# Patient Record
Sex: Female | Born: 1955 | Race: White | Hispanic: No | Marital: Single | Smoking: Never smoker
Health system: Southern US, Community
[De-identification: ages and names within clinical notes are randomized; demographics above are authoritative.]

## PROBLEM LIST (undated history)

## (undated) DIAGNOSIS — J309 Allergic rhinitis, unspecified: Secondary | ICD-10-CM

## (undated) DIAGNOSIS — G5600 Carpal tunnel syndrome, unspecified upper limb: Secondary | ICD-10-CM

## (undated) DIAGNOSIS — K5730 Diverticulosis of large intestine without perforation or abscess without bleeding: Secondary | ICD-10-CM

## (undated) DIAGNOSIS — G250 Essential tremor: Secondary | ICD-10-CM

## (undated) DIAGNOSIS — R32 Unspecified urinary incontinence: Secondary | ICD-10-CM

## (undated) DIAGNOSIS — D649 Anemia, unspecified: Secondary | ICD-10-CM

## (undated) DIAGNOSIS — G43909 Migraine, unspecified, not intractable, without status migrainosus: Secondary | ICD-10-CM

## (undated) DIAGNOSIS — N959 Unspecified menopausal and perimenopausal disorder: Secondary | ICD-10-CM

## (undated) DIAGNOSIS — E785 Hyperlipidemia, unspecified: Secondary | ICD-10-CM

## (undated) DIAGNOSIS — Z86010 Personal history of colonic polyps: Secondary | ICD-10-CM

## (undated) DIAGNOSIS — M129 Arthropathy, unspecified: Secondary | ICD-10-CM

## (undated) DIAGNOSIS — F411 Generalized anxiety disorder: Secondary | ICD-10-CM

## (undated) DIAGNOSIS — G252 Other specified forms of tremor: Secondary | ICD-10-CM

## (undated) DIAGNOSIS — I8390 Asymptomatic varicose veins of unspecified lower extremity: Secondary | ICD-10-CM

## (undated) DIAGNOSIS — R0789 Other chest pain: Secondary | ICD-10-CM

## (undated) HISTORY — DX: Carpal tunnel syndrome, unspecified upper limb: G56.00

## (undated) HISTORY — DX: Hyperlipidemia, unspecified: E78.5

## (undated) HISTORY — DX: Migraine, unspecified, not intractable, without status migrainosus: G43.909

## (undated) HISTORY — DX: Unspecified urinary incontinence: R32

## (undated) HISTORY — DX: Diverticulosis of large intestine without perforation or abscess without bleeding: K57.30

## (undated) HISTORY — DX: Anemia, unspecified: D64.9

## (undated) HISTORY — DX: Essential tremor: G25.0

## (undated) HISTORY — DX: Arthropathy, unspecified: M12.9

## (undated) HISTORY — DX: Unspecified menopausal and perimenopausal disorder: N95.9

## (undated) HISTORY — DX: Other chest pain: R07.89

## (undated) HISTORY — DX: Allergic rhinitis, unspecified: J30.9

## (undated) HISTORY — DX: Personal history of colonic polyps: Z86.010

## (undated) HISTORY — DX: Asymptomatic varicose veins of unspecified lower extremity: I83.90

## (undated) HISTORY — DX: Other specified forms of tremor: G25.2

## (undated) HISTORY — DX: Generalized anxiety disorder: F41.1

---
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
## 1999-05-16 ENCOUNTER — Emergency Department (HOSPITAL_COMMUNITY): Admission: EM | Admit: 1999-05-16 | Discharge: 1999-05-16 | Payer: Self-pay | Admitting: *Deleted

## 2002-05-18 HISTORY — PX: ABDOMINAL HYSTERECTOMY: SHX81

## 2004-11-12 ENCOUNTER — Emergency Department (HOSPITAL_COMMUNITY): Admission: EM | Admit: 2004-11-12 | Discharge: 2004-11-12 | Payer: Self-pay | Admitting: Emergency Medicine

## 2006-05-18 HISTORY — PX: REPLACEMENT TOTAL KNEE: SUR1224

## 2006-08-27 ENCOUNTER — Emergency Department (HOSPITAL_COMMUNITY): Admission: EM | Admit: 2006-08-27 | Discharge: 2006-08-27 | Payer: Self-pay | Admitting: Emergency Medicine

## 2006-09-30 ENCOUNTER — Encounter: Payer: Self-pay | Admitting: Internal Medicine

## 2006-09-30 LAB — CONVERTED CEMR LAB
ALT: 14 units/L
AST: 11 units/L
Alkaline Phosphatase: 78 units/L
BUN: 16 mg/dL
CO2: 25 meq/L
Calcium: 9.1 mg/dL
Chloride: 101 meq/L
Cholesterol: 233 mg/dL
Creatinine, Ser: 0.65 mg/dL
Glucose, Bld: 94 mg/dL
HCT: 43.9 %
HDL: 47 mg/dL
Hemoglobin: 15 g/dL
LDL Cholesterol: 162 mg/dL
MCV: 88.4 fL
Platelets: 320 10*3/uL
Potassium: 4.4 meq/L
RBC: 4.97 M/uL
RDW: 14 %
Sodium: 138 meq/L
TSH: 1.002 microintl units/mL
Total Bilirubin: 0.4 mg/dL
Triglyceride fasting, serum: 119 mg/dL
WBC: 6.4 10*3/uL

## 2008-10-16 LAB — CONVERTED CEMR LAB: Pap Smear: NEGATIVE

## 2009-04-22 ENCOUNTER — Encounter: Payer: Self-pay | Admitting: Internal Medicine

## 2009-04-22 LAB — CONVERTED CEMR LAB
ALT: 37 units/L
AST: 13 units/L
Albumin: 4.4 g/dL
Alkaline Phosphatase: 101 units/L
BUN: 16 mg/dL
CO2: 23 meq/L
Calcium: 9 mg/dL
Chloride: 104 meq/L
Cholesterol: 250 mg/dL
Creatinine, Ser: 0.61 mg/dL
Glucose, Bld: 85 mg/dL
HCT: 42.1 %
HDL: 50 mg/dL
Hemoglobin: 13.4 g/dL
LDL Cholesterol: 154 mg/dL
Lymphocytes, automated: 34.8 %
MCV: 90.6 fL
Neutrophils Relative %: 57.8 %
Platelets: 357 10*3/uL
Potassium: 4.3 meq/L
RBC: 4.65 M/uL
RDW: 13.2 %
Sodium: 140 meq/L
TSH: 0.502 microintl units/mL
Total Bilirubin: 0.4 mg/dL
Total Protein: 7.1 g/dL
Triglyceride fasting, serum: 230 mg/dL
WBC: 7.6 10*3/uL

## 2009-06-18 DIAGNOSIS — Z86010 Personal history of colonic polyps: Secondary | ICD-10-CM

## 2009-06-18 HISTORY — DX: Personal history of colonic polyps: Z86.010

## 2009-06-24 ENCOUNTER — Encounter: Payer: Self-pay | Admitting: Internal Medicine

## 2009-06-28 ENCOUNTER — Ambulatory Visit: Payer: Self-pay | Admitting: Internal Medicine

## 2009-06-28 DIAGNOSIS — G252 Other specified forms of tremor: Secondary | ICD-10-CM

## 2009-06-28 DIAGNOSIS — R059 Cough, unspecified: Secondary | ICD-10-CM | POA: Insufficient documentation

## 2009-06-28 DIAGNOSIS — N959 Unspecified menopausal and perimenopausal disorder: Secondary | ICD-10-CM | POA: Insufficient documentation

## 2009-06-28 DIAGNOSIS — K5730 Diverticulosis of large intestine without perforation or abscess without bleeding: Secondary | ICD-10-CM | POA: Insufficient documentation

## 2009-06-28 DIAGNOSIS — D649 Anemia, unspecified: Secondary | ICD-10-CM | POA: Insufficient documentation

## 2009-06-28 DIAGNOSIS — E785 Hyperlipidemia, unspecified: Secondary | ICD-10-CM | POA: Insufficient documentation

## 2009-06-28 DIAGNOSIS — N952 Postmenopausal atrophic vaginitis: Secondary | ICD-10-CM | POA: Insufficient documentation

## 2009-06-28 DIAGNOSIS — J309 Allergic rhinitis, unspecified: Secondary | ICD-10-CM | POA: Insufficient documentation

## 2009-06-28 DIAGNOSIS — F411 Generalized anxiety disorder: Secondary | ICD-10-CM | POA: Insufficient documentation

## 2009-06-28 DIAGNOSIS — Z860100 Personal history of colon polyps, unspecified: Secondary | ICD-10-CM | POA: Insufficient documentation

## 2009-06-28 DIAGNOSIS — G250 Essential tremor: Secondary | ICD-10-CM | POA: Insufficient documentation

## 2009-06-28 DIAGNOSIS — R05 Cough: Secondary | ICD-10-CM

## 2009-06-28 DIAGNOSIS — R079 Chest pain, unspecified: Secondary | ICD-10-CM | POA: Insufficient documentation

## 2009-06-28 DIAGNOSIS — R32 Unspecified urinary incontinence: Secondary | ICD-10-CM | POA: Insufficient documentation

## 2009-06-28 DIAGNOSIS — G43909 Migraine, unspecified, not intractable, without status migrainosus: Secondary | ICD-10-CM | POA: Insufficient documentation

## 2009-06-28 DIAGNOSIS — M129 Arthropathy, unspecified: Secondary | ICD-10-CM | POA: Insufficient documentation

## 2009-07-03 ENCOUNTER — Telehealth (INDEPENDENT_AMBULATORY_CARE_PROVIDER_SITE_OTHER): Payer: Self-pay | Admitting: *Deleted

## 2009-07-04 ENCOUNTER — Encounter: Payer: Self-pay | Admitting: Internal Medicine

## 2009-08-15 ENCOUNTER — Telehealth (INDEPENDENT_AMBULATORY_CARE_PROVIDER_SITE_OTHER): Payer: Self-pay | Admitting: *Deleted

## 2009-08-19 ENCOUNTER — Telehealth (INDEPENDENT_AMBULATORY_CARE_PROVIDER_SITE_OTHER): Payer: Self-pay

## 2009-09-11 ENCOUNTER — Telehealth (INDEPENDENT_AMBULATORY_CARE_PROVIDER_SITE_OTHER): Payer: Self-pay | Admitting: *Deleted

## 2009-09-11 DIAGNOSIS — R0789 Other chest pain: Secondary | ICD-10-CM

## 2009-09-11 HISTORY — DX: Other chest pain: R07.89

## 2009-09-12 ENCOUNTER — Ambulatory Visit: Payer: Self-pay | Admitting: Cardiology

## 2009-09-12 ENCOUNTER — Ambulatory Visit: Payer: Self-pay

## 2009-09-12 ENCOUNTER — Encounter (HOSPITAL_COMMUNITY): Admission: RE | Admit: 2009-09-12 | Discharge: 2009-11-20 | Payer: Self-pay | Admitting: Internal Medicine

## 2009-09-27 ENCOUNTER — Ambulatory Visit: Payer: Self-pay | Admitting: Internal Medicine

## 2009-09-27 DIAGNOSIS — G5600 Carpal tunnel syndrome, unspecified upper limb: Secondary | ICD-10-CM | POA: Insufficient documentation

## 2009-09-30 ENCOUNTER — Ambulatory Visit: Payer: Self-pay | Admitting: Internal Medicine

## 2009-09-30 LAB — CONVERTED CEMR LAB
ALT: 26 units/L (ref 0–35)
AST: 20 units/L (ref 0–37)
Albumin: 3.8 g/dL (ref 3.5–5.2)
Alkaline Phosphatase: 80 units/L (ref 39–117)
Bilirubin, Direct: 0.1 mg/dL (ref 0.0–0.3)
Cholesterol: 183 mg/dL (ref 0–200)
HDL: 49.6 mg/dL (ref >39.00)
LDL Cholesterol: 110 mg/dL — ABNORMAL HIGH (ref 0–99)
Total Bilirubin: 0.8 mg/dL (ref 0.3–1.2)
Total CHOL/HDL Ratio: 4
Total Protein: 6.6 g/dL (ref 6.0–8.3)
Triglycerides: 119 mg/dL (ref 0.0–149.0)
VLDL: 23.8 mg/dL (ref 0.0–40.0)

## 2009-12-27 ENCOUNTER — Telehealth: Payer: Self-pay | Admitting: Internal Medicine

## 2009-12-30 ENCOUNTER — Ambulatory Visit: Payer: Self-pay | Admitting: Internal Medicine

## 2009-12-30 DIAGNOSIS — R300 Dysuria: Secondary | ICD-10-CM | POA: Insufficient documentation

## 2009-12-30 DIAGNOSIS — I8390 Asymptomatic varicose veins of unspecified lower extremity: Secondary | ICD-10-CM | POA: Insufficient documentation

## 2009-12-30 LAB — CONVERTED CEMR LAB
Cholesterol: 205 mg/dL — ABNORMAL HIGH (ref 0–200)
Hemoglobin, Urine: NEGATIVE
Ketones, ur: NEGATIVE mg/dL
Specific Gravity, Urine: 1.02 (ref 1.000–1.030)
Total Protein, Urine: NEGATIVE mg/dL
Urine Glucose: NEGATIVE mg/dL
Urobilinogen, UA: 0.2 (ref 0.0–1.0)

## 2010-01-31 ENCOUNTER — Telehealth: Payer: Self-pay | Admitting: Internal Medicine

## 2010-02-04 ENCOUNTER — Ambulatory Visit: Payer: Self-pay | Admitting: Internal Medicine

## 2010-02-04 DIAGNOSIS — J329 Chronic sinusitis, unspecified: Secondary | ICD-10-CM | POA: Insufficient documentation

## 2010-02-04 DIAGNOSIS — R197 Diarrhea, unspecified: Secondary | ICD-10-CM | POA: Insufficient documentation

## 2010-04-04 ENCOUNTER — Ambulatory Visit: Payer: Self-pay | Admitting: Internal Medicine

## 2010-04-04 DIAGNOSIS — N390 Urinary tract infection, site not specified: Secondary | ICD-10-CM | POA: Insufficient documentation

## 2010-04-04 LAB — CONVERTED CEMR LAB
Glucose, Urine, Semiquant: NEGATIVE
Nitrite: NEGATIVE
Protein, U semiquant: NEGATIVE
WBC Urine, dipstick: NEGATIVE

## 2010-04-21 ENCOUNTER — Telehealth: Payer: Self-pay | Admitting: Internal Medicine

## 2010-05-23 ENCOUNTER — Telehealth: Payer: Self-pay | Admitting: Internal Medicine

## 2010-06-17 NOTE — Progress Notes (Signed)
Summary: Nuclear Pre-Procedure  Phone Note Outgoing Call Call back at [facility] Phone [phone]   Call placed by: Stanton Kidney, EMT-P,  August 15, 2009 11:15 AM Action Taken: Phone Call Completed Summary of Call: Left message with information on [employer] Information Sheet (see scanned document for details).     Nuclear Med Background Indications for Stress Test: Evaluation for Ischemia     Symptoms: Chest Pain    Nuclear Pre-Procedure Cardiac Risk Factors: Family History - CAD, Lipids Height (in): 61

## 2010-06-17 NOTE — Assessment & Plan Note (Signed)
Summary: 3-4 MTH FU   STC   Vital Signs:  Patient profile:   55 year old female Height:      61 inches (154.94 cm) Weight:      179.12 pounds (81.42 kg) O2 Sat:      96 % on Room air Temp:     97.4 degrees F (36.33 degrees C) oral Pulse rate:   70 / minute BP sitting:   122 / 88  (left arm) Cuff size:   regular  Vitals Entered By: Orlan Leavens RMA (December 30, 2009 8:59 AM)  O2 Flow:  Room air CC: 3 month follow-up Is Patient Diabetic? No Pain Assessment Patient in pain? no        Primary Care Provider:  Newt Lukes MD  CC:  3 month follow-up.  History of Present Illness: here for 3 month f/u  1) c/o continued hot flashes - related to menopausal syndrome -  took HRT for 3 years following hysterectomy in 2005 - but still with flushing and sweating on or off med- also c/o vaginal dryness and pain with intercourse, low libido since hysterectomy - has not started estrogen cream - taking effexor - no adv SE related to same  2) dyslipidemia -  reports compliance with ongoing medical treatment and no changes in medication dose or frequency. denies adverse side effects related to current therapy. has increase fish oil use- no GI or mskel c/o   3) anxiety -onset 2007- precipitated by sudden death of her sister a/w insomnia - seldom sleeps more that 4h/night - denies daytime sleepiness +tremor when stressed, esp head and neck +palpitations; denies sadness, tearfulness or depression  tol effexor well - but still not sleeping - not using klonopin ot OTC sleep aides  4) allg rhinitis - taking claritin and flonase - symptoms improved less cough with controlled PND symptoms   Preventive Screening-Counseling & Management  Alcohol-Tobacco     Alcohol drinks/day: <1     Alcohol Counseling: not indicated; patient does not drink     Smoking Status: never     Tobacco Counseling: not indicated; no tobacco use  Clinical Review Panels:  Prevention   Last Mammogram:  No  specific mammographic evidence of malignancy.   (10/16/2008)   Last Pap Smear:  Interpretation/Result:Negative for intraepithelial Lesion or Malignancy.    (10/16/2008)   Last Colonoscopy:  Location:  [facility].   Impression: 1. small internal hemorrhoids 2. scattered sigmoid diveticulosis 3. One isolated diverticulum in the right colon 4. One small sessile right colon polyp rempved by cold snare 5. Normal terminal ileum (06/24/2009)  Immunizations   Last Tetanus Booster:  Historical (05/18/2006)   Last Flu Vaccine:  Fluvax 3+ (06/28/2009)  Lipid Management   Cholesterol:  183 (09/30/2009)   LDL (bad choesterol):  110 (09/30/2009)   HDL (good cholesterol):  49.60 (09/30/2009)   Triglycerides:  230 (04/22/2009)  CBC   WBC:  7.6 (04/22/2009)   RBC:  4.65 (04/22/2009)   Hgb:  13.4 (04/22/2009)   Hct:  42.1 (04/22/2009)   Platelets:  357 (04/22/2009)   MCV  90.6 (04/22/2009)   RDW  13.2 (04/22/2009)   PMN:  57.8 (04/22/2009)  Complete Metabolic Panel   Glucose:  85 (04/22/2009)   Sodium:  140 (04/22/2009)   Potassium:  4.3 (04/22/2009)   Chloride:  104 (04/22/2009)   CO2:  23 (04/22/2009)   BUN:  16 (04/22/2009)   Creatinine:  0.61 (04/22/2009)   Albumin:  3.8 (09/30/2009)  Total Protein:  6.6 (09/30/2009)   Calcium:  9.0 (04/22/2009)   Total Bili:  0.8 (09/30/2009)   Alk Phos:  80 (09/30/2009)   SGPT (ALT):  26 (09/30/2009)   SGOT (AST):  20 (09/30/2009)   Current Medications (verified): 1)  One-A-Day Womens Formula  Tabs (Multiple Vitamins-Calcium) .... Once Daily 2)  Fish Oil 1000 Mg Caps (Omega-3 Fatty Acids) .... Once Daily 3)  Vitamin D 1000 Unit Tabs (Cholecalciferol) .... Once Daily 4)  Tessalon Perles 100 Mg Caps (Benzonatate) .Marland Kitchen.. 1 By Mouth Two Times A Day X 2 Weeks, Then Every 8 Hours As Needed For Cough 5)  Omeprazole 20 Mg Cpdr (Omeprazole) .Marland Kitchen.. 1 By Mouth Two Times A Day 6)  Claritin 10 Mg Tabs (Loratadine) .Marland Kitchen.. 1 By Mouth Once  Daily 7)  Premarin 0.625 Mg/gm Crea (Estrogens, Conjugated) .... Apply 2x/week 8)  Klonopin 0.5 Mg Tabs (Clonazepam) .Marland Kitchen.. 1-2 By Mouth At Bedtime As Needed For Sleep 9)  Zocor 40 Mg Tabs (Simvastatin) .Marland Kitchen.. 1 By Mouth At Bedtime 10)  Flonase 50 Mcg/act Susp (Fluticasone Propionate) .... 2 Sprays Each Nostril  Every Morning 11)  Effexor Xr 150 Mg Xr24h-Cap (Venlafaxine Hcl) .Marland Kitchen.. 1 By Mouth Once Daily  Allergies (verified): 1)  ! Codeine 2)  ! Morphine  Past History:  Past Medical History: dyslipidemia anxiety benign essential tremor - head/neck, mild Allergic rhinitis Anemia-NOS Diverticulitis, hx of Colonic polyps, hx   MD roster: GI - mann  Review of Systems  The patient denies fever, chest pain, syncope, and peripheral edema.         c/o tenderness over (spider) vericose veins R>L; also c/o strong urine smell with freq voiding  Physical Exam  General:  overweight-appearing.  alert, well-developed, well-nourished, and cooperative to examination.    Lungs:  normal respiratory effort, no intercostal retractions or use of accessory muscles; normal breath sounds bilaterally - no crackles and no wheezes.    Heart:  normal rate, regular rhythm, no murmur, and no rub. BLE without edema.  Skin:  spider veins over RLE>LLE, min inflammed but no ulceration or redness Psych:  Oriented X3, memory intact for recent and remote, normally interactive, good eye contact, not anxious appearing, not depressed appearing, and not agitated.      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 1-2 by mouth at bedtime as needed  for sleep    Effexor Xr 150 Mg Xr24h-cap (Venlafaxine hcl) .Marland Kitchen... 1 by mouth once daily    Amitriptyline Hcl 10 Mg Tabs (Amitriptyline hcl) .Marland Kitchen... 1 by mouth at bedtime x 7 days, then increase to 2 by mouth at bedtime (or as directed)  cont effexor150 and try amitriptline for sleep - new erx done ok to use klonopin if needed- reminded BZs most effective if not used every night but ok as needed   Orders: Prescription Created Electronically [id number])  Problem # 4:  DYSLIPIDEMIA (ICD-272.4)  Her updated medication list for this problem includes:    Zocor 40 Mg Tabs (Simvastatin) .Marland Kitchen... 1 by mouth at bedtime  Orders: TLB-Lipid Panel (80061-LIPID)  Labs Reviewed: SGOT: 20 (09/30/2009)   SGPT: 26 (09/30/2009)   HDL:49.60 (  09/30/2009), 50 (04/22/2009)  LDL:110 (09/30/2009), 154 (04/22/2009)  Chol:183 (09/30/2009), 250 (04/22/2009)  Trig:119.0 (09/30/2009), 230 (04/22/2009)  Complete Medication List: 1)  One-a-day Womens Formula Tabs (Multiple vitamins-calcium) .... Once daily 2)  Fish Oil 1000 Mg Caps (Omega-3 fatty acids) .... 2 pills once daily 3)  Vitamin D 1000 Unit Tabs (Cholecalciferol) .... Once daily 4)  Tessalon Perles 100 Mg Caps (Benzonatate) .Marland Kitchen.. 1 by mouth two times a day x 2 weeks, then every 8 hours as needed for cough 5)  Omeprazole 20 Mg Cpdr (Omeprazole) .Marland Kitchen.. 1 by mouth two times a day 6)  Claritin 10 Mg Tabs (Loratadine) .Marland Kitchen.. 1 by mouth once daily 7)  Premarin 0.625 Mg/gm Crea (Estrogens, conjugated) .... Apply 2x/week 8)  Klonopin 0.5 Mg Tabs (Clonazepam) .Marland Kitchen.. 1-2 by mouth at bedtime as needed for sleep 9)  Zocor 40 Mg Tabs (Simvastatin) .Marland Kitchen.. 1 by mouth at bedtime 10)  Flonase 50 Mcg/act Susp (Fluticasone propionate) .... 2 sprays each nostril  every morning 11)  Effexor Xr 150 Mg Xr24h-cap (Venlafaxine hcl) .Marland Kitchen.. 1 by mouth once daily 12)  Amitriptyline Hcl 10 Mg Tabs (Amitriptyline hcl) .Marland Kitchen.. 1 by mouth at bedtime x 7 days, then increase to 2 by mouth at bedtime (or as  directed) 13)  Compression Hose, Thigh High  .... Wear each am and remove at bedtime; icd9: 454.9  Patient Instructions: 1)  it was good to see you today. 2)  test(s) ordered today - your results will be  called to you in 48-72 hours from the time of test completion 3)  add amitriptyline for sleep as discussed - your prescription has been electronically submitted to your pharmacy. Please take as directed. Contact our office if you believe you're having problems with the medication(s).  4)  no other medication changes 5)  use compression hose for vericose vein symptoms and keep feet elevated (when you can!) 6)  Please schedule a follow-up appointment in 3-4 months, sooner if problems.  Prescriptions: COMPRESSION HOSE, THIGH HIGH wear each AM and remove at bedtime; ICD9: 454.9  #1 pair x 1   Entered and Authorized by:   Newt Lukes MD   Signed by:   Newt Lukes MD on 12/30/2009   Method used:   Print then Give to Patient   RxID:   [id number] AMITRIPTYLINE HCL 10 MG TABS (AMITRIPTYLINE HCL) 1 by mouth at bedtime x 7 days, then increase to 2 by mouth at bedtime (or as directed)  #60 x 6   Entered and Authorized by:   Newt Lukes MD   Signed by:   Newt Lukes MD on 12/30/2009   Method used:   Electronically to        [facility]* (retail)       [address]       [place], [place]  [address]       Ph: [phone]       Fax: [phone]   RxID:   [id number]

## 2010-06-17 NOTE — Progress Notes (Signed)
Summary: Paperwork received from [facility]  28 pages of paperwork received from [facility]. Paperwork forwarded to Dr. Felicity Coyer for review. Theresa Jones  July 03, 2009 12:37 PM

## 2010-06-17 NOTE — Progress Notes (Signed)
Summary: nausea, ?med  Phone Note Call from Patient Call back at [phone]   Call For: Theresa Jones Summary of Call: pt with 24h nausea and mild diarrhea, vomitting yesterday at onset - no fever or abd pain - pt requests med for nausea - erx for promethazine and ondansteron done - pt informed of same, to call if fever or symptoms unimproved; instructed on bland diet + hydration Initial call taken by: Theresa Jones,  January 31, 2010 8:27 AM    New/Updated Medications: PROMETHAZINE HCL 25 MG TABS (PROMETHAZINE HCL) 1 by mouth every 4 hours as needed for nausea (may cause sedation) ONDANSETRON HCL 4 MG TABS (ONDANSETRON HCL) 1 by mouth every 6-8 hours as needed for nausea unrelieved by promethazine Prescriptions: ONDANSETRON HCL 4 MG TABS (ONDANSETRON HCL) 1 by mouth every 6-8 hours as needed for nausea unrelieved by promethazine  #10 x 0   Entered and Authorized by:   Theresa Jones   Signed by:   Theresa Jones on 01/31/2010   Method used:   Electronically to        [facility]* (retail)       [address]       [place], [place]  [address]       Ph: [phone]       Fax: [phone]   RxID:   [id number] PROMETHAZINE HCL 25 MG TABS (PROMETHAZINE HCL) 1 by mouth every 4 hours as needed for nausea (may cause sedation)  #30 x 0   Entered and Authorized by:   Theresa Jones   Signed by:   Theresa Jones on 01/31/2010   Method used:   Electronically to        [facility]* (retail)       [address]       [place], [place]  [address]       Ph: [phone]       Fax: [phone]   RxID:   [id number]

## 2010-06-17 NOTE — Procedures (Signed)
Summary: Colonoscopy/[facility]  Colonoscopy/[facility]   Imported By: Sherian Rein 07/04/2009 11:30:14  _____________________________________________________________________  External Attachment:    Type:   Image     Comment:   External Document

## 2010-06-17 NOTE — Assessment & Plan Note (Signed)
Summary: new / [facility] / #/ cd   Vital Signs:  Patient profile:   55 year old female Height:      61 inches (154.94 cm) Weight:      174.8 pounds (79.45 kg) BMI:     33.15 O2 Sat:      97 % on Room air Temp:     98.2 degrees F (36.78 degrees C) oral Pulse rate:   72 / minute BP sitting:   112 / 82  (left arm) Cuff size:   regular  Vitals Entered By: Orlan Leavens (June 28, 2009 10:44 AM)  O2 Flow:  Room air CC: New patient/ pt complaining of cough Is Patient Diabetic? No Pain Assessment Patient in pain? no        Primary Care Provider:  Newt Lukes MD  CC:  New patient/ pt complaining of cough.  History of Present Illness: new pt to me and our practice - here to est care  1) c/o cough - onset 4 days ago gradual onset of symptoms but progressing to more frequent events during day and night symptoms precipitated by allerrgy flare and sick contacts at home/work denies fever or chest congestion - +ST and postnasal drip - also +seasonal allg worse this time of year with weather changes mild sinus pressure both frontal and maxillary regions - not taking any OTC meds - used to take teassalon for same  2) c/o hot flashes - related to menopausal syndrome -  took HRT for 3 years following hysterectomy in 2005 - but still with flushing and sweating also c/o vaginal dryness and pain with intercourse, low libido since hysterectomy  3) dyslipidemia -  prev on statin med - unsure what type - took daily until refills ran out ?[facility] - report not available for me today  4)?CAD - +strong FH same in both parents - never has had stress test -  occ CP events, numbness radiating into right UE no nausea or jaw pain, no dizziness or syncope, no DOE  5) c/o anxiety - onset 3 years ago following sudden death of her sister a/w insomnia - seldom sleep more that 4h/night - denies daytime sleepiness trial paxil 3 yr ago after the funeral - did  nothing so stopped after 6 weeks +tremor when stressed, esp head and neck +palpitations denies sadness, tearfulness or depression  Preventive Screening-Counseling & Management  Alcohol-Tobacco     Alcohol drinks/day: <1     Alcohol Counseling: not indicated; patient does not drink     Smoking Status: never     Tobacco Counseling: not indicated; no tobacco use  Caffeine-Diet-Exercise     Caffeine Counseling: not indicated; caffeine use is not excessive or problematic     Does Patient Exercise: no     Exercise Counseling: to improve exercise regimen     Depression Counseling: not indicated; screening negative for depression  Safety-Violence-Falls     Seat Belt Use: yes     Seat Belt Counseling: not indicated; patient wears seat belts     Helmet Use: yes     Helmet Counseling: not applicable     Firearms in the Home: firearms in the home     Firearm Counseling: not indicated; uses recommended firearm safety measures     Smoke Detectors: yes     Smoke Detector Counseling: no     Violence in the Home: no risk noted     Sexual Abuse: no  Fall Risk Counseling: not indicated; no significant falls noted  Clinical Review Panels:  Prevention   Last Mammogram:  No specific mammographic evidence of malignancy.   (10/16/2008)   Last Pap Smear:  Interpretation/Result:Negative for intraepithelial Lesion or Malignancy.    (10/16/2008)  Immunizations   Last Tetanus Booster:  Historical (05/18/2006)   Last Flu Vaccine:  Fluvax 3+ (06/28/2009)   Current Medications (verified): 1)  One-A-Day Womens Formula  Tabs (Multiple Vitamins-Calcium) .... Once Daily 2)  Fish Oil 1000 Mg Caps (Omega-3 Fatty Acids) .... Qd 3)  Vitamin D 1000 Unit Tabs (Cholecalciferol) .... Once Daily  Allergies (verified): 1)  ! Codeine 2)  ! Morphine  Past History:  Past Medical History: dyslipidemia anxiety benign essential tremor - head/neck, mild Allergic rhinitis Anemia-NOS Diverticulitis, hx  of Urinary incontinence Colonic polyps, hx of  Past Surgical History: Broken leg, had blood clot 1967 Knee replacement 2008 Hysterectomy (2004) benign  Family History: Family History of Alcoholism/Addiction (parent, grandparent) Family History of Arthritis (parent) Family History Breast cancer 1st degree relative <50 (other relativ) Family History of Colon CA 1st degree relative <60 (other relativ) Family History High cholesterol (parent, other relative) Family History Hypertension (other relative0 Family History Lung cancer (parent) Family History Ovarian cancer (other realtive) Stroke (parent)  sister  - expired suddenly age 8y -?anuerysm dad - expired age 55y - lung cancer, CAD with MI in 49s, HTN, chol  Social History: Never Smoked social occassional alcohol married, lives with spouse employed as Production designer, theatre/television/film of [employer] Smoking Status:  never Does Patient Exercise:  no Seat Belt Use:  yes  Review of Systems       The patient complains of weight gain and prolonged cough.  The patient denies anorexia, fever, vision loss, hoarseness, syncope, dyspnea on exertion, peripheral edema, headaches, hemoptysis, abdominal pain, severe indigestion/heartburn, muscle weakness, suspicious skin lesions, transient blindness, difficulty walking, depression, unusual weight change, and abnormal bleeding.         also see HPI above. I have reviewed all other systems and they were negative.   Physical Exam  General:  overweight-appearing.  alert, well-developed, well-nourished, and cooperative to examination.    Eyes:  vision grossly intact; pupils equal, round and reactive to light.  conjunctiva and lids normal.   wears corr lenses Ears:  normal pinnae bilaterally, without erythema, swelling, or tenderness to palpation. TMs clear, without effusion, or cerumen impaction. Hearing grossly normal bilaterally  Mouth:  teeth and gums in good repair; mucous membranes moist, without lesions or  ulcers. oropharynx clear without exudate, mild erythema. +PND -clear Neck:  supple, full ROM, no masses, no thyromegaly; no thyroid nodules or tenderness. no JVD or carotid bruits.   Lungs:  normal respiratory effort, no intercostal retractions or use of accessory muscles; normal breath sounds bilaterally - no crackles and no wheezes.    Heart:  normal rate, regular rhythm, no murmur, and no rub. BLE without edema. normal DP pulses and normal cap refill in all 4 extremities    Abdomen:  protuberant, soft, non-tender, normal bowel sounds, no distention; no masses and no appreciable hepatomegaly or splenomegaly.   Genitalia:  defer Msk:  No deformity or scoliosis noted of thoracic or lumbar spine.   Neurologic:  alert & oriented X3 and cranial nerves II-XII symetrically intact.  strength normal in all extremities, sensation intact to light touch, and gait normal. speech fluent without dysarthria or aphasia; follows commands with good comprehension.  Skin:  no rashes, vesicles, ulcers, or erythema.  No nodules or irregularity to palpation.  Psych:  Oriented X3, memory intact for recent and remote, normally interactive, good eye contact, not anxious appearing, not depressed appearing, and not agitated.      Impression & Recommendations:  Problem # 1:  COUGH (ICD-786.2)  exam benign - O2 normal- nonsmoker expect multifact to ?URI atop chronic reflux and allg rhinitis/sinusitus - resume tessalon as had good relief with this in past - also 2 week course of omeprazole 2x/d and daily clartin - then as needed   Orders: Prescription Created Electronically [phone])  Problem # 2:  DYSLIPIDEMIA (ICD-272.4)  need to review lipid profile - likely need resume prior tx --- pharm called - prev rx'd zocor 40 -- but not refilled in mos d/t no refills will plan to start now and alt tx as needed depending on labs  Her updated medication list for this problem includes:    Zocor 40 Mg Tabs (Simvastatin) .Marland Kitchen... 1  by mouth at bedtime  Orders: Prescription Created Electronically [phone])  Problem # 3:  CHEST PAIN UNSPECIFIED (ICD-786.50)  none at this time but given +FH CAD and personal risk of overwight and chol, refer for stress test  Orders: Misc. Referral (Misc. Ref)  Problem # 4:  ANXIETY STATE, UNSPECIFIED (ICD-300.00)  multifact - will start SNRI also targeting hot flash symptoms - pt agrees - may still need alt agent for sleep-- to f/u next visit, sooner if needed Her updated medication list for this problem includes:    Effexor Xr 75 Mg Xr24h-cap (Venlafaxine hcl) .Marland Kitchen... 1 by mouth every morning    Klonopin 0.5 Mg Tabs (Clonazepam) .Marland Kitchen... 1-2 by mouth at bedtime as needed for sleep  Orders: Prescription Created Electronically [phone])  Problem # 5:  VAGINITIS, ATROPHIC (ICD-627.3)  Her updated medication list for this problem includes:    Premarin 0.625 Mg/gm Crea (Estrogens, conjugated) .Marland Kitchen... Apply 2x/week  Discussed treatment options.   Problem # 6:  POSTMENOPAUSAL SYNDROME (ICD-627.9)  Her updated medication list for this problem includes:    Premarin 0.625 Mg/gm Crea (Estrogens, conjugated) .Marland Kitchen... Apply 2x/week  Problem # 7:  TREMOR, ESSENTIAL (ICD-333.1) mild head and neck symptoms -  no troublesome enough at this time to require Bbloc or other tx - will follow and address as needed   Complete Medication List: 1)  One-a-day Womens Formula Tabs (Multiple vitamins-calcium) .... Once daily 2)  Fish Oil 1000 Mg Caps (Omega-3 fatty acids) .... Qd 3)  Vitamin D 1000 Unit Tabs (Cholecalciferol) .... Once daily 4)  Tessalon Perles 100 Mg Caps (Benzonatate) .Marland Kitchen.. 1 by mouth two times a day x 2 weeks, then every 8 hours as needed for cough 5)  Omeprazole 20 Mg Cpdr (Omeprazole) .Marland Kitchen.. 1 by mouth two times a day 6)  Claritin 10 Mg Tabs (Loratadine) .Marland Kitchen.. 1 by mouth once daily 7)  Effexor Xr 75 Mg Xr24h-cap (Venlafaxine hcl) .Marland Kitchen.. 1 by mouth every morning 8)  Premarin 0.625 Mg/gm Crea  (Estrogens, conjugated) .... Apply 2x/week 9)  Klonopin 0.5 Mg Tabs (Clonazepam) .Marland Kitchen.. 1-2 by mouth at bedtime as needed for sleep 10)  Zocor 40 Mg Tabs (Simvastatin) .Marland Kitchen.. 1 by mouth at bedtime  Other Orders: Admin 1st Vaccine (27253) Flu Vaccine 22yrs + (66440)  Patient Instructions: 1)  it was good to see you today. 2)  will send for records on labs and tests recently done by other providers - 3)  will plan on resuming cholesterol medication after review of these labs and talking to  your pharmacy about previously prescribed medication 4)  start new medications for cough and allergy as below 5)  also start Premarin cream for dryness and pain symptoms  6)  also start effexor for anxiety and hot flash symptoms  7)  ok to continue klonopin if needed for sleep in addition to these other medications 8)  we'll make referral for cardiac stress test. Our office will contact you regarding this appointment once made.  9)  Please schedule a follow-up appointment in 3 months to contine reviewing these issues, sooner if problems.  Prescriptions: ZOCOR 40 MG TABS (SIMVASTATIN) 1 by mouth at bedtime  #30 x 5   Entered and Authorized by:   Newt Lukes MD   Signed by:   Newt Lukes MD on 06/28/2009   Method used:   Electronically to        [facility]* (retail)       [address]       [place], [place]  [address]       Ph: [phone]       Fax: [phone]   RxID:   [id number] PREMARIN 0.625 MG/GM CREA (ESTROGENS, CONJUGATED) apply 2x/week  #1 x 3   Entered and Authorized by:   Newt Lukes MD   Signed by:   Newt Lukes MD on 06/28/2009   Method used:   Electronically to        [facility]* (retail)       [address]       [place], [place]  [address]       Ph: [phone]       Fax: [phone]   RxID:   [id number] EFFEXOR XR 75 MG XR24H-CAP (VENLAFAXINE HCL) 1 by mouth every morning  #30 x 3   Entered and Authorized by:   Newt Lukes MD   Signed by:   Newt Lukes MD on 06/28/2009   Method used:   Electronically to        [facility]* (retail)       [address]       [place], [place]  [address]       Ph: [phone]       Fax: [phone]   RxID:   [id number] CLARITIN 10 MG TABS (LORATADINE) 1 by mouth once daily  #30 x 3   Entered and Authorized by:   Newt Lukes MD   Signed by:   Newt Lukes MD on 06/28/2009   Method used:   Electronically to        [facility]* (retail)       [address]       [place], [place]  [address]       Ph: [phone]       Fax: [phone]   RxID:   [id number] OMEPRAZOLE 20 MG CPDR (OMEPRAZOLE) 1 by mouth two times a day  #60 x 3   Entered and Authorized by:   Newt Lukes MD   Signed by:   Newt Lukes MD on 06/28/2009   Method used:   Electronically to        [facility]* (retail)       [address]       [place], [place]  [address]       Ph: [phone]       Fax: [phone]  RxID:   [id number] TESSALON PERLES 100 MG CAPS (BENZONATATE) 1 by mouth two times a day x 2 weeks, then every 8 hours as needed for cough  #40 x 2   Entered and Authorized by:   Newt Lukes MD   Signed by:   Newt Lukes MD on 06/28/2009   Method used:   Electronically to        [facility]- [facility]* (retail)       [address]       [place], [place]  [address]       Ph: [phone]       Fax: [phone]   RxID:   [id number]    Pap Smear  Procedure date:  10/16/2008  Findings:      Interpretation/Result:Negative for intraepithelial Lesion or Malignancy.     Mammogram  Procedure date:  10/16/2008  Findings:      No specific mammographic evidence of malignancy.   Flu Vaccine Consent Questions     Do you have a history of severe allergic reactions to this vaccine? no    Any prior history of allergic reactions to egg and/or gelatin? no    Do you have a sensitivity to the  preservative Thimersol? no    Do you have a past history of Guillan-Barre Syndrome? no    Do you currently have an acute febrile illness? no    Have you ever had a severe reaction to latex? no    Vaccine information given and explained to patient? yes    Are you currently pregnant? no    Lot Number:[id number]   Exp Date:11/14/2009   Site Given  Left Deltoid IM  Immunization History:  Tetanus/Td Immunization History:    Tetanus/Td:  historical (05/18/2006)  .lbflu

## 2010-06-17 NOTE — Progress Notes (Signed)
Summary: sinus pressure - CT ordered  Phone Note Call from Patient   Call For: Newt Lukes MD Summary of Call: pt called MD to discuss continued sinus problems - pain above and below both eyes, a/w teeth pain and congestion - no drainage or fever - using rx and otc tx for same but not improving - not better since last abx/pred tx for same - will order CT sinus to further eval same and consider ENT eval depending on these results - pt understands same Initial call taken by: Newt Lukes MD,  April 21, 2010 11:01 AM

## 2010-06-17 NOTE — Progress Notes (Signed)
Summary: loratidine  Phone Note Refill Request Message from:  Fax from Pharmacy on December 27, 2009 8:30 AM  Refills Requested: Medication #1:  CLARITIN 10 MG TABS 1 by mouth once daily  Method Requested: Electronic Initial call taken by: Orlan Leavens RMA,  December 27, 2009 8:30 AM    Prescriptions: CLARITIN 10 MG TABS (LORATADINE) 1 by mouth once daily  #30 x 6   Entered by:   Orlan Leavens RMA   Authorized by:   Newt Lukes MD   Signed by:   Orlan Leavens RMA on 12/27/2009   Method used:   Electronically to        [facility]- [facility]* (retail)       [address]       [place], [place]  [address]       Ph: [phone]       Fax: [phone]   RxID:   [id number]

## 2010-06-17 NOTE — Assessment & Plan Note (Signed)
Summary: Cardiology Nuclear Study  Nuclear Med Background Indications for Stress Test: Evaluation for Ischemia  Indications Comments: NO PRIOR EKG'S FOR COMPARISON   History Comments: NO DOCUMENTED CAD  Symptoms: Chest Pain, Diaphoresis, DOE, Fatigue, Rapid HR  Symptoms Comments: Last episode of CP:2 months ago.   Nuclear Pre-Procedure Cardiac Risk Factors: Family History - CAD, Lipids, Obesity Caffeine/Decaff Intake: None NPO After: 8:00 PM Lungs: Clear IV 0.9% NS with Angio Cath: 22g     IV Site: (R) Hand IV Started by: Irean Hong RN Chest Size (in) 36     Cup Size D     Height (in): 61 Weight (lb): 169 BMI: 32.05  Nuclear Med Study 1 or 2 day study:  1 day     Stress Test Type:  Stress Reading MD:  Willa Rough, MD     Referring MD:  Rene Paci, MD Resting Radionuclide:  Technetium 79m Tetrofosmin     Resting Radionuclide Dose:  10 mCi  Stress Radionuclide:  Technetium 1m Tetrofosmin     Stress Radionuclide Dose:  33 mCi   Stress Protocol Exercise Time (min):  10:01 min     Max HR:  155 bpm     Predicted Max HR:  167 bpm  Max Systolic BP: 144 mm Hg     Percent Max HR:  92.81 %     METS: 11.7 Rate Pressure Product:  84696    Stress Test Technologist:  Rea College CMA-N     Nuclear Technologist:  Domenic Polite CNMT  Rest Procedure  Myocardial perfusion imaging was performed at rest 45 minutes following the intravenous administration of Myoview Technetium 32m Tetrofosmin.  Stress Procedure  The patient exercised for 10:01.  The patient stopped due to fatigue and denied any chest pain.  There were no significant ST-T wave changes.  Myoview was injected at peak exercise and myocardial perfusion imaging was performed after a brief delay.  QPS Raw Data Images:  Patient motion noted; appropriate software correction applied. Stress Images:  There is normal uptake in all areas. Rest Images:  Normal homogeneous uptake in all areas of the myocardium. Subtraction  (SDS):  No evidence of ischemia. Transient Ischemic Dilatation:  .98  (Normal <1.22)  Lung/Heart Ratio:  .33  (Normal <0.45)  Quantitative Gated Spect Images QGS EDV:  67 ml QGS ESV:  21 ml QGS EF:  68 % QGS cine images:  Normal motion  Findings Normal nuclear study      Overall Impression  Exercise Capacity: Good exercise capacity. BP Response: Normal blood pressure response. Clinical Symptoms: No chest pain ECG Impression: No significant ST segment change suggestive of ischemia. Overall Impression: Normal stress nuclear study.

## 2010-06-17 NOTE — Progress Notes (Signed)
Summary: Nuclear Pre-Procedure  Phone Note Outgoing Call   Call placed by: Milana Na, EMT-P,  September 11, 2009 12:43 PM Summary of Call: Reviewed information on Myoview Information Sheet (see scanned document for further details).  Spoke with patient.     Nuclear Med Background Indications for Stress Test: Evaluation for Ischemia     Symptoms: Chest Pain    Nuclear Pre-Procedure Cardiac Risk Factors: Family History - CAD, Lipids Height (in): 61  Nuclear Med Study Referring MD:  V.Leschber

## 2010-06-17 NOTE — Assessment & Plan Note (Signed)
Summary: back pain--nausea---stc   Vital Signs:  Patient profile:   55 year old female Height:      61 inches (154.94 cm) Weight:      179 pounds (81.36 kg) O2 Sat:      96 % on Room air Temp:     97.3 degrees F (36.28 degrees C) oral Pulse rate:   76 / minute BP sitting:   118 / 82  (left arm) Cuff size:   large  Vitals Entered By: Orlan Leavens RMA (April 04, 2010 11:10 AM)  O2 Flow:  Room air CC: (L) back pain & nausea Is Patient Diabetic? No Pain Assessment Patient in pain? yes     Location: lower back & in upper thigh area Type: aching Comments Pt also c/o burning when she urinates x's 2-3 days   Primary Care [place]:  Newt Lukes MD  CC:  (L) back pain & nausea.  History of Present Illness: here for sinus congestion - a/w fever and chills, left sided frontal headache and sinus pressure also left ear pain and "full feeling" both sides onset 4 days ago - course progressive - initially a/w n/v and d - now vomitting resolved but cont nausea no abd pain or CP - no syncopre or change in vision symptoms not improved with flonase or otc meds  L lateral leg and back pain and vaginal itch/burn  also reviewed chronic med issues: 1) c/o continued hot flashes - related to menopausal syndrome -  took HRT for 3 years following hysterectomy in 2005 - but still with flushing and sweating on or off med- also c/o vaginal dryness and pain with intercourse, low libido since hysterectomy - has not started estrogen cream - taking effexor - no adv SE related to same  2) dyslipidemia -  reports compliance with ongoing medical treatment and no changes in medication dose or frequency. denies adverse side effects related to current therapy. has increase fish oil use- no GI or mskel c/o   3) anxiety -onset 2007- precipitated by sudden death of her sister a/w insomnia - seldom sleeps more that 4h/night - denies daytime sleepiness +tremor when stressed, esp head and  neck +palpitations; denies sadness, tearfulness or depression  tol effexor well - but still not sleeping - not using klonopin ot OTC sleep aides  4) allg rhinitis - taking claritin and flonase - symptoms improved less cough with controlled PND symptoms   Clinical Review Panels:  Lipid Management   Cholesterol:  205 (12/30/2009)   LDL (bad choesterol):  110 (09/30/2009)   HDL (good cholesterol):  51.00 (12/30/2009)   Triglycerides:  230 (04/22/2009)  CBC   WBC:  7.6 (04/22/2009)   RBC:  4.65 (04/22/2009)   Hgb:  13.4 (04/22/2009)   Hct:  42.1 (04/22/2009)   Platelets:  357 (04/22/2009)   MCV  90.6 (04/22/2009)   RDW  13.2 (04/22/2009)   PMN:  57.8 (04/22/2009)  Complete Metabolic Panel   Glucose:  85 (04/22/2009)   Sodium:  140 (04/22/2009)   Potassium:  4.3 (04/22/2009)   Chloride:  104 (04/22/2009)   CO2:  23 (04/22/2009)   BUN:  16 (04/22/2009)   Creatinine:  0.61 (04/22/2009)   Albumin:  3.8 (09/30/2009)   Total Protein:  6.6 (09/30/2009)   Calcium:  9.0 (04/22/2009)   Total Bili:  0.8 (09/30/2009)   Alk Phos:  80 (09/30/2009)   SGPT (ALT):  26 (09/30/2009)   SGOT (AST):  20 (09/30/2009)   Current Medications (verified):  1)  One-A-Day Womens Formula  Tabs (Multiple Vitamins-Calcium) .... Once Daily 2)  Fish Oil 1000 Mg Caps (Omega-3 Fatty Acids) .... 2 Pills Once Daily 3)  Vitamin D 1000 Unit Tabs (Cholecalciferol) .... Once Daily 4)  Tessalon Perles 100 Mg Caps (Benzonatate) .Marland Kitchen.. 1 By Mouth Two Times A Day X 2 Weeks, Then Every 8 Hours As Needed For Cough 5)  Omeprazole 20 Mg Cpdr (Omeprazole) .Marland Kitchen.. 1 By Mouth Two Times A Day 6)  Claritin 10 Mg Tabs (Loratadine) .Marland Kitchen.. 1 By Mouth Once Daily 7)  Premarin 0.625 Mg/gm Crea (Estrogens, Conjugated) .... Apply 2x/week 8)  Klonopin 0.5 Mg Tabs (Clonazepam) .Marland Kitchen.. 1-2 By Mouth At Bedtime As Needed For Sleep 9)  Zocor 40 Mg Tabs (Simvastatin) .Marland Kitchen.. 1 By Mouth At Bedtime 10)  Flonase 50 Mcg/act Susp (Fluticasone Propionate)  .... 2 Sprays Each Nostril  Every Morning 11)  Effexor Xr 150 Mg Xr24h-Cap (Venlafaxine Hcl) .Marland Kitchen.. 1 By Mouth Once Daily 12)  Amitriptyline Hcl 10 Mg Tabs (Amitriptyline Hcl) .Marland Kitchen.. 1 By Mouth At Bedtime X 7 Days, Then Increase To 2 By Mouth At Bedtime (Or As Directed)  Allergies (verified): 1)  ! Codeine 2)  ! Morphine  Past History:  Past Medical History: dyslipidemia anxiety benign essential tremor - head/neck, mild Allergic rhinitis Anemia-NOS  Diverticulitis, hx Colonic polyps, hx   MD roster: GI - mann  Review of Systems       The patient complains of weight gain.  The patient denies decreased hearing, dyspnea on exertion, peripheral edema, and hemoptysis.    Physical Exam  General:  overweight-appearing.  alert, well-developed, well-nourished, and cooperative to examination.  mild-ill appearing   Head:  Normocephalic and atraumatic without obvious abnormalities. No apparent alopecia or balding. Eyes:  vision grossly intact; pupils equal, round and reactive to light. lids normal.    Ears:  normal pinnae bilaterally, without erythema, swelling, or tenderness to palpation. TMs hazy with mod effusion and erythema L>R, no cerumen impaction. Hearing grossly normal bilaterally  Mouth:  teeth and gums in good repair; mucous membranes moist, without lesions or ulcers. oropharynx clear without exudate, mild erythema. +PND -yellow Lungs:  normal respiratory effort, no intercostal retractions or use of accessory muscles; normal breath sounds bilaterally - no crackles and no wheezes.    Heart:  normal rate, regular rhythm, no murmur, and no rub. BLE without edema.  Msk:  back: full range of motion of lumbar spine. Nontender to palpation. Deep tendon reflexes symmetrically intact and sensation intact throughout all dermatomes in bilateral lower extremities. Full strength to manual muscle testing in all major muscle groups. Able to heel and toe walk without difficulty and ambulates with a  normal gait.    Impression & Recommendations:  Problem # 1:  SINUSITIS (ICD-473.9)  Her updated medication list for this problem includes:    Tessalon Perles 100 Mg Caps (Benzonatate) .Marland Kitchen... 1 by mouth two times a day x 2 weeks, then every 8 hours as needed for cough    Flonase 50 Mcg/act Susp (Fluticasone propionate) .Marland Kitchen... 2 sprays each nostril  every morning    Azithromycin 250 Mg Tabs (Azithromycin) .Marland Kitchen... 2 tabs by mouth today, then 1 by mouth daily starting tomorrow  progressive symptoms - failing conserv symptoms mgmt - add abx, systemic steroids for inflamation and eustatian dysfx cont nasal steroids, antitussives and antihistamine - cont hydration, tylenol/ibuprofen  Orders: Prescription Created Electronically [id number])  Problem # 2:  DYSURIA (ICD-788.1)  Udip with mild hematura - ?yeast  vaginitis - add topical monistat   Complete Medication List: 1)  One-a-day Womens Formula Tabs (Multiple vitamins-calcium) .... Once daily 2)  Fish Oil 1000 Mg Caps (Omega-3 fatty acids) .... 2 pills once daily 3)  Vitamin D 1000 Unit Tabs (Cholecalciferol) .... Once daily 4)  Tessalon Perles 100 Mg Caps (Benzonatate) .Marland Kitchen.. 1 by mouth two times a day x 2 weeks, then every 8 hours as needed for cough 5)  Omeprazole 20 Mg Cpdr (Omeprazole) .Marland Kitchen.. 1 by mouth two times a day 6)  Claritin 10 Mg Tabs (Loratadine) .Marland Kitchen.. 1 by mouth once daily 7)  Premarin 0.625 Mg/gm Crea (Estrogens, conjugated) .... Apply 2x/week 8)  Klonopin 0.5 Mg Tabs (Clonazepam) .Marland Kitchen.. 1-2 by mouth at bedtime as needed for sleep 9)  Zocor 40 Mg Tabs (Simvastatin) .Marland Kitchen.. 1 by mouth at bedtime 10)  Flonase 50 Mcg/act Susp (Fluticasone propionate) .... 2 sprays each nostril  every morning 11)  Effexor Xr 150 Mg Xr24h-cap (Venlafaxine hcl) .Marland Kitchen.. 1 by mouth once daily 12)  Amitriptyline Hcl 10 Mg Tabs (Amitriptyline hcl) .Marland Kitchen.. 1 by mouth at bedtime x 7 days, then increase to 2 by mouth at bedtime (or as directed) 13)  Prednisone (pak) 5 Mg  Tabs (Prednisone) .... As directed  x 6 days 14)  Azithromycin 250 Mg Tabs (Azithromycin) .... 2 tabs by mouth today, then 1 by mouth daily starting tomorrow 15)  Monistat 7 2 % Crea (Miconazole nitrate) .... Apply as directed two times a day x 7 days then as needed  Other Orders: UA Dipstick w/o Micro (manual) (16109)  Patient Instructions: 1)  it was good to see you today. 2)  for sinus symptoms - zpak and prednisone - your prescriptions have been electronically submitted to your pharmacy. Please take as directed. Contact our office if you believe you're having problems with the medication(s).  3)  for burn, use monistat as dscussed - pick up over the counter 4)  hydrate hydrate hydrate! 5)  Get plenty of rest, drink lots of clear liquids, and use Tylenol or Ibuprofen for fever and comfort. Return in 7-10 days if you're not better:sooner if you're feeling worse. Prescriptions: AZITHROMYCIN 250 MG TABS (AZITHROMYCIN) 2 tabs by mouth today, then 1 by mouth daily starting tomorrow  #6 x 0   Entered and Authorized by:   Newt Lukes MD   Signed by:   Newt Lukes MD on 04/04/2010   Method used:   Electronically to        [facility]* (retail)       [address]       [place], [place]  [address]       Ph: [phone]       Fax: [phone]   RxID:   [id number] PREDNISONE (PAK) 5 MG TABS (PREDNISONE) as directed  x 6 days  #1 x 0   Entered and Authorized by:   Newt Lukes MD   Signed by:   Newt Lukes MD on 04/04/2010   Method used:   Electronically to        [facility]* (retail)       [address]       [place], [place]  [address]       Ph: [phone]       Fax: [phone]   RxID:   [id number]    Orders Added: 1)  UA Dipstick w/o Micro (manual) [81002] 2)  Est. Patient Level  IV [id number] 3)  Prescription Created Electronically [id number]    Laboratory Results   Urine Tests    Routine Urinalysis   Color:  yellow Appearance: Clear Glucose: negative   (Normal Range: Negative) Bilirubin: negative   (Normal Range: Negative) Ketone: negative   (Normal Range: Negative) Spec. Gravity: 1.015   (Normal Range: 1.003-1.035) Blood: moderate   (Normal Range: Negative) pH: 5.0   (Normal Range: 5.0-8.0) Protein: negative   (Normal Range: Negative) Urobilinogen: 0.2   (Normal Range: 0-1) Nitrite: negative   (Normal Range: Negative) Leukocyte Esterace: negative   (Normal Range: Negative)

## 2010-06-17 NOTE — Progress Notes (Signed)
Summary: Myoview cancellation  Phone Note Outgoing Call Call back at [facility] Phone [phone]   Call placed by: Theresa Hong, RN,  August 19, 2009 11:09 AM Summary of Call: The patient was a no show today for myoview. I called  and spoke with a family member. The patient is out of town in [place], [place]. The family member said the patient had called here last week, and cancelled the myoview. The patient will call us back next week to reschedule.  Dr. Felicity Coyer notified via flag in EMR.Theresa Peixoto,RN.

## 2010-06-17 NOTE — Assessment & Plan Note (Signed)
Summary: SINUS INFECTION /NWS   Vital Signs:  Patient profile:   55 year old female Height:      61 inches (154.94 cm) Weight:      179 pounds (81.36 kg) O2 Sat:      98 % on Room air Temp:     97.1 degrees F (36.17 degrees C) oral Pulse rate:   73 / minute BP sitting:   100 / 78  (left arm) Cuff size:   regular  Vitals Entered By: Orlan Leavens RMA (February 04, 2010 11:24 AM)  O2 Flow:  Room air CC: sinus infection Is Patient Diabetic? No Pain Assessment Patient in pain? no        Primary Care Provider:  Newt Lukes MD  CC:  sinus infection.  History of Present Illness: here for sinus congestion - a/w fever and chills, headache and sinus pressure also ear pain and "full feeling" both sides onset 6 days ago - course progressive - initially a/w n/v and d - now vomitting resolved but cont anorexia no abd pain or CP no syncope or dizziness but feels weak symptoms min improved with nausea meds, ibuprofen and tyelnol - but not resolved +travel , no sick contacts known  also reviewed chronic med issues: 1) c/o continued hot flashes - related to menopausal syndrome -  took HRT for 3 years following hysterectomy in 2005 - but still with flushing and sweating on or off med- also c/o vaginal dryness and pain with intercourse, low libido since hysterectomy - has not started estrogen cream - taking effexor - no adv SE related to same  2) dyslipidemia -  reports compliance with ongoing medical treatment and no changes in medication dose or frequency. denies adverse side effects related to current therapy. has increase fish oil use- no GI or mskel c/o   3) anxiety -onset 2007- precipitated by sudden death of her sister a/w insomnia - seldom sleeps more that 4h/night - denies daytime sleepiness +tremor when stressed, esp head and neck +palpitations; denies sadness, tearfulness or depression  tol effexor well - but still not sleeping - not using klonopin ot OTC sleep  aides  4) allg rhinitis - taking claritin and flonase - symptoms improved less cough with controlled PND symptoms   Clinical Review Panels:  Lipid Management   Cholesterol:  205 (12/30/2009)   LDL (bad choesterol):  110 (09/30/2009)   HDL (good cholesterol):  51.00 (12/30/2009)   Triglycerides:  230 (04/22/2009)  CBC   WBC:  7.6 (04/22/2009)   RBC:  4.65 (04/22/2009)   Hgb:  13.4 (04/22/2009)   Hct:  42.1 (04/22/2009)   Platelets:  357 (04/22/2009)   MCV  90.6 (04/22/2009)   RDW  13.2 (04/22/2009)   PMN:  57.8 (04/22/2009)  Complete Metabolic Panel   Glucose:  85 (04/22/2009)   Sodium:  140 (04/22/2009)   Potassium:  4.3 (04/22/2009)   Chloride:  104 (04/22/2009)   CO2:  23 (04/22/2009)   BUN:  16 (04/22/2009)   Creatinine:  0.61 (04/22/2009)   Albumin:  3.8 (09/30/2009)   Total Protein:  6.6 (09/30/2009)   Calcium:  9.0 (04/22/2009)   Total Bili:  0.8 (09/30/2009)   Alk Phos:  80 (09/30/2009)   SGPT (ALT):  26 (09/30/2009)   SGOT (AST):  20 (09/30/2009)   Current Medications (verified): 1)  One-A-Day Womens Formula  Tabs (Multiple Vitamins-Calcium) .... Once Daily 2)  Fish Oil 1000 Mg Caps (Omega-3 Fatty Acids) .... 2 Pills Once Daily 3)  Vitamin D 1000 Unit Tabs (Cholecalciferol) .... Once Daily 4)  Tessalon Perles 100 Mg Caps (Benzonatate) .Marland Kitchen.. 1 By Mouth Two Times A Day X 2 Weeks, Then Every 8 Hours As Needed For Cough 5)  Omeprazole 20 Mg Cpdr (Omeprazole) .Marland Kitchen.. 1 By Mouth Two Times A Day 6)  Claritin 10 Mg Tabs (Loratadine) .Marland Kitchen.. 1 By Mouth Once Daily 7)  Premarin 0.625 Mg/gm Crea (Estrogens, Conjugated) .... Apply 2x/week 8)  Klonopin 0.5 Mg Tabs (Clonazepam) .Marland Kitchen.. 1-2 By Mouth At Bedtime As Needed For Sleep 9)  Zocor 40 Mg Tabs (Simvastatin) .Marland Kitchen.. 1 By Mouth At Bedtime 10)  Flonase 50 Mcg/act Susp (Fluticasone Propionate) .... 2 Sprays Each Nostril  Every Morning 11)  Effexor Xr 150 Mg Xr24h-Cap (Venlafaxine Hcl) .Marland Kitchen.. 1 By Mouth Once Daily 12)  Amitriptyline  Hcl 10 Mg Tabs (Amitriptyline Hcl) .Marland Kitchen.. 1 By Mouth At Bedtime X 7 Days, Then Increase To 2 By Mouth At Bedtime (Or As Directed)  Allergies (verified): 1)  ! Codeine 2)  ! Morphine  Past History:  Past Medical History: dyslipidemia anxiety benign essential tremor - head/neck, mild Allergic rhinitis Anemia-NOS  Diverticulitis, hx of Colonic polyps, hx   MD roster: GI - mann  Review of Systems       The patient complains of anorexia.  The patient denies dyspnea on exertion, peripheral edema, prolonged cough, hemoptysis, melena, hematochezia, severe indigestion/heartburn, difficulty walking, and enlarged lymph nodes.    Physical Exam  General:  overweight-appearing.  alert, well-developed, well-nourished, and cooperative to examination.  mod-ill appearing   Eyes:  bilateral glassy eyes, mild conjunctivitis - vision grossly intact; pupils equal, round and reactive to light. lids normal.    Ears:  normal pinnae bilaterally, without erythema, swelling, or tenderness to palpation. TMs hazy with mod effusion L>R, no cerumen impaction. Hearing grossly normal bilaterally  Mouth:  teeth and gums in good repair; mucous membranes moist, without lesions or ulcers. oropharynx clear without exudate, mild erythema. +PND -clear Lungs:  normal respiratory effort, no intercostal retractions or use of accessory muscles; normal breath sounds bilaterally - no crackles and no wheezes.    Heart:  normal rate, regular rhythm, no murmur, and no rub. BLE without edema.  Neurologic:  alert & oriented X3 and cranial nerves II-XII symetrically intact.  strength normal in all extremities, sensation intact to light touch, and gait normal. speech fluent without dysarthria or aphasia; follows commands with good comprehension.    Impression & Recommendations:  Problem # 1:  SINUSITIS (ICD-473.9)  progressive symptoms - failing conserv symptoms mgmt - add abx, systemic steroids for inflamation and eustatian  dysfx cont nasal steroids, antitussive and antihistamine - cont hydration, tylenol/ibuprofen Her updated medication list for this problem includes:    Tessalon Perles 100 Mg Caps (Benzonatate) .Marland Kitchen... 1 by mouth two times a day x 2 weeks, then every 8 hours as needed for cough    Flonase 50 Mcg/act Susp (Fluticasone propionate) .Marland Kitchen... 2 sprays each nostril  every morning    Amoxicillin-pot Clavulanate 875-125 Mg Tabs (Amoxicillin-pot clavulanate) .Marland Kitchen... 1 by mouth two times a day x 7 days  Orders: Depo- Medrol 80mg  (J1040) Admin of Therapeutic Inj  intramuscular or subcutaneous (40347) Prescription Created Electronically [phone]  Take antibiotics for full duration. Discussed treatment options including indications for coronal CT scan of sinuses and ENT referral.   Problem # 2:  DIARRHEA (ICD-787.91)  rec otc immodium - to call if worse w/ augmentin tx hydrate aggressively given >6days GI loss and  mild hypotension - BRAT diet reviewed given cont nausea and anorexia symptoms   Discussed symptom control and diet. Call if worsening of symptoms or signs of dehydration.   Complete Medication List: 1)  One-a-day Womens Formula Tabs (Multiple vitamins-calcium) .... Once daily 2)  Fish Oil 1000 Mg Caps (Omega-3 fatty acids) .... 2 pills once daily 3)  Vitamin D 1000 Unit Tabs (Cholecalciferol) .... Once daily 4)  Tessalon Perles 100 Mg Caps (Benzonatate) .Marland Kitchen.. 1 by mouth two times a day x 2 weeks, then every 8 hours as needed for cough 5)  Omeprazole 20 Mg Cpdr (Omeprazole) .Marland Kitchen.. 1 by mouth two times a day 6)  Claritin 10 Mg Tabs (Loratadine) .Marland Kitchen.. 1 by mouth once daily 7)  Premarin 0.625 Mg/gm Crea (Estrogens, conjugated) .... Apply 2x/week 8)  Klonopin 0.5 Mg Tabs (Clonazepam) .Marland Kitchen.. 1-2 by mouth at bedtime as needed for sleep 9)  Zocor 40 Mg Tabs (Simvastatin) .Marland Kitchen.. 1 by mouth at bedtime 10)  Flonase 50 Mcg/act Susp (Fluticasone propionate) .... 2 sprays each nostril  every morning 11)  Effexor Xr  150 Mg Xr24h-cap (Venlafaxine hcl) .Marland Kitchen.. 1 by mouth once daily 12)  Amitriptyline Hcl 10 Mg Tabs (Amitriptyline hcl) .Marland Kitchen.. 1 by mouth at bedtime x 7 days, then increase to 2 by mouth at bedtime (or as directed) 13)  Amoxicillin-pot Clavulanate 875-125 Mg Tabs (Amoxicillin-pot clavulanate) .Marland Kitchen.. 1 by mouth two times a day x 7 days  Patient Instructions: 1)  it was good to see you today. 2)  generic augmentin for your sinus - your prescriptions have been electronically submitted to your pharmacy. Please take as directed. Contact our office if you believe you're having problems with the medication(s).  3)  steroid shot given today - will give the flu shot later 4)  use immodium as discussed for diarrhea - max 8tab/24h 5)  stay out of work x 48h 6)  Get plenty of rest, drink lots of clear liquids, and use Tylenol alternating with Ibuprofen for fever and comfort. Return in 7-10 days if you're not better:sooner if you're feeling worse. Prescriptions: AMOXICILLIN-POT CLAVULANATE 875-125 MG TABS (AMOXICILLIN-POT CLAVULANATE) 1 by mouth two times a day x 7 days  #14 x 0   Entered and Authorized by:   Newt Lukes MD   Signed by:   Newt Lukes MD on 02/04/2010   Method used:   Electronically to        [facility]* (retail)       [address]       [place], [place]  [address]       Ph: [phone]       Fax: [phone]   RxID:   [id number]    Medication Administration  Injection # 1:    Medication: Depo- Medrol 80mg     Diagnosis: SINUSITIS (ICD-473.9)    Route: IM    Site: LUOQ gluteus    Exp Date: 08/2012    Lot #: [id number]    Mfr: [employer]    Patient tolerated injection without complications    Given by: Orlan Leavens RMA (February 04, 2010 12:11 PM)  Orders Added: 1)  Depo- Medrol 80mg  [J1040] 2)  Admin of Therapeutic Inj  intramuscular or subcutaneous [96372] 3)  Est. Patient Level IV [53664] 4)  Prescription Created Electronically [id number]

## 2010-06-17 NOTE — Assessment & Plan Note (Signed)
Summary: 3 MTH FU---STC   Vital Signs:  Patient profile:   55 year old female Height:      61 inches (154.94 cm) Weight:      175.2 pounds (79.64 kg) O2 Sat:      96 % on Room air Temp:     97.2 degrees F (36.22 degrees C) oral Pulse rate:   73 / minute BP sitting:   112 / 80  (left arm) Cuff size:   regular  Vitals Entered By: Orlan Leavens (Sep 27, 2009 2:41 PM)  O2 Flow:  Room air CC: 3 month follow-up/ Need rx for klonopin Is Patient Diabetic? No Pain Assessment Patient in pain? no        Primary Care Provider:  Newt Lukes MD  CC:  3 month follow-up/ Need rx for klonopin.  History of Present Illness: here for 3 month f/u  1) c/o continued hot flashes - related to menopausal syndrome -  took HRT for 3 years following hysterectomy in 2005 - but still with flushing and sweating on or off med- also c/o vaginal dryness and pain with intercourse, low libido since hysterectomy - has not started estrogen cream - taking effexor - no adv SE related to same  2) dyslipidemia -  reports compliance with ongoing medical treatment and no changes in medication dose or frequency. denies adverse side effects related to current therapy. no GI or mskel c/o - not fasting but will return for labs fasting next week  3)?CAD - +strong FH same in both parents - had stress test 08/2009 - no ischemic changes no nausea or jaw pain, no dizziness or syncope, no DOE  4) c/o anxiety - onset 3 years ago following sudden death of her sister a/w insomnia - seldom sleep more that 4h/night - denies daytime sleepiness trial paxil 3 yr ago after the funeral - did nothing so stopped after 6 weeks +tremor when stressed, esp head and neck +palpitations denies sadness, tearfulness or depression again tol effexor well - ?refill on klonopin -  5) c/o numbness in both hands - had been concerned about heart but since stress test ok, still having numbness- symptoms worse upon waking in am - no  weakness in hands or arms - exac by working and cooking (active with hands) never been dx wth CTS or tried wrist splints  6) allg rhinitis - taking claritin and not using afrin - request rx for flonase  Clinical Review Panels:  Prevention   Last Mammogram:  No specific mammographic evidence of malignancy.   (10/16/2008)   Last Pap Smear:  Interpretation/Result:Negative for intraepithelial Lesion or Malignancy.    (10/16/2008)   Last Colonoscopy:  Location:  [facility].   Impression: 1. small internal hemorrhoids 2. scattered sigmoid diveticulosis 3. One isolated diverticulum in the right colon 4. One small sessile right colon polyp rempved by cold snare 5. Normal terminal ileum (06/24/2009)  Lipid Management   Cholesterol:  250 (04/22/2009)   LDL (bad choesterol):  154 (04/22/2009)   HDL (good cholesterol):  50 (04/22/2009)   Triglycerides:  230 (04/22/2009)  CBC   WBC:  7.6 (04/22/2009)   RBC:  4.65 (04/22/2009)   Hgb:  13.4 (04/22/2009)   Hct:  42.1 (04/22/2009)   Platelets:  357 (04/22/2009)   MCV  90.6 (04/22/2009)   RDW  13.2 (04/22/2009)   PMN:  57.8 (04/22/2009)   Current Medications (verified): 1)  One-A-Day Womens Formula  Tabs (Multiple Vitamins-Calcium) .... Once Daily 2)  Fish Oil 1000 Mg Caps (Omega-3 Fatty Acids) .... Qd 3)  Vitamin D 1000 Unit Tabs (Cholecalciferol) .... Once Daily 4)  Tessalon Perles 100 Mg Caps (Benzonatate) .Marland Kitchen.. 1 By Mouth Two Times A Day X 2 Weeks, Then Every 8 Hours As Needed For Cough 5)  Omeprazole 20 Mg Cpdr (Omeprazole) .Marland Kitchen.. 1 By Mouth Two Times A Day 6)  Claritin 10 Mg Tabs (Loratadine) .Marland Kitchen.. 1 By Mouth Once Daily 7)  Effexor Xr 75 Mg Xr24h-Cap (Venlafaxine Hcl) .Marland Kitchen.. 1 By Mouth Every Morning 8)  Premarin 0.625 Mg/gm Crea (Estrogens, Conjugated) .... Apply 2x/week 9)  Klonopin 0.5 Mg Tabs (Clonazepam) .Marland Kitchen.. 1-2 By Mouth At Bedtime As Needed For Sleep 10)  Zocor 40 Mg Tabs (Simvastatin) .Marland Kitchen.. 1 By Mouth At  Bedtime  Allergies (verified): 1)  ! Codeine 2)  ! Morphine  Past History:  Past Medical History: dyslipidemia anxiety benign essential tremor - head/neck, mild Allergic rhinitis Anemia-NOS Diverticulitis, hx of Urinary incontinence Colonic polyps, hx of    Past Surgical History: Reviewed history from 06/28/2009 and no changes required. Broken leg, had blood clot 1967 Knee replacement 2008 Hysterectomy (2004)  Review of Systems  The patient denies weight gain, chest pain, and headaches.    Physical Exam  General:  overweight-appearing.  alert, well-developed, well-nourished, and cooperative to examination.    Lungs:  normal respiratory effort, no intercostal retractions or use of accessory muscles; normal breath sounds bilaterally - no crackles and no wheezes.    Heart:  normal rate, regular rhythm, no murmur, and no rub. BLE without edema. normal DP pulses and normal cap refill in all 4 extremities    Msk:  No deformity or scoliosis noted of thoracic or lumbar spine.  no effusions or deformities of wrists, hands with +tinnels L>R Neurologic:  alert & oriented X3 and cranial nerves II-XII symetrically intact.  strength normal in all extremities, sensation intact to light touch, and gait normal. speech fluent without dysarthria or aphasia; follows commands with good comprehension.  Psych:  Oriented X3, memory intact for recent and remote, normally interactive, good eye contact, not anxious appearing, not depressed appearing, and not agitated.      Impression & Recommendations:  Problem # 1:  CARPAL TUNNEL SYNDROME, BILATERAL (ICD-354.0)  as stress test neg for ischemia, hx of numbness most c/w CTS -  try nighttime wrist splints - consider NCS if not improved - neuro and MSkel exam otherwise normal Orders: Splints- All Types (J4782)  Labs Reviewed: TSH: 0.502 (04/22/2009)     Problem # 2:  ALLERGIC RHINITIS (ICD-477.9) add nasal sterois - e-rx done Her updated  medication list for this problem includes:    Claritin 10 Mg Tabs (Loratadine) .Marland Kitchen... 1 by mouth once daily    Flonase 50 Mcg/act Susp (Fluticasone propionate) .Marland Kitchen... 2 sprays each nostril  every morning  Orders: Prescription Created Electronically [phone])  Problem # 3:  ANXIETY STATE, UNSPECIFIED (ICD-300.00)  inc effexor dose to 150 and ok refill on klonopin - reminded BZs most effective if not used every night consider other sleep agent if needed - trazadone or elavil Her updated medication list for this problem includes:    Klonopin 0.5 Mg Tabs (Clonazepam) .Marland Kitchen... 1-2 by mouth at bedtime as needed for sleep    Effexor Xr 150 Mg Xr24h-cap (Venlafaxine hcl) .Marland Kitchen... 1 by mouth once daily  Orders: Prescription Created Electronically [phone])  Problem # 4:  POSTMENOPAUSAL SYNDROME (ICD-627.9) also inc SNRI as above Her updated medication list for this  problem includes:    Premarin 0.625 Mg/gm Crea (Estrogens, conjugated) .Marland Kitchen... Apply 2x/week  Problem # 5:  DYSLIPIDEMIA (ICD-272.4) tol statin well -  to return next week when fasting for FLP and LFTs  Her updated medication list for this problem includes:    Zocor 40 Mg Tabs (Simvastatin) .Marland Kitchen... 1 by mouth at bedtime  Labs Reviewed: SGOT: 13 (04/22/2009)   SGPT: 37 (04/22/2009)   HDL:50 (04/22/2009), 47 (09/30/2006)  LDL:154 (04/22/2009), 162 (09/30/2006)  Chol:250 (04/22/2009), 233 (09/30/2006)  Trig:230 (04/22/2009), 119 (09/30/2006)  Complete Medication List: 1)  One-a-day Womens Formula Tabs (Multiple vitamins-calcium) .... Once daily 2)  Fish Oil 1000 Mg Caps (Omega-3 fatty acids) .... Once daily 3)  Vitamin D 1000 Unit Tabs (Cholecalciferol) .... Once daily 4)  Tessalon Perles 100 Mg Caps (Benzonatate) .Marland Kitchen.. 1 by mouth two times a day x 2 weeks, then every 8 hours as needed for cough 5)  Omeprazole 20 Mg Cpdr (Omeprazole) .Marland Kitchen.. 1 by mouth two times a day 6)  Claritin 10 Mg Tabs (Loratadine) .Marland Kitchen.. 1 by mouth once daily 7)  Premarin  0.625 Mg/gm Crea (Estrogens, conjugated) .... Apply 2x/week 8)  Klonopin 0.5 Mg Tabs (Clonazepam) .Marland Kitchen.. 1-2 by mouth at bedtime as needed for sleep 9)  Zocor 40 Mg Tabs (Simvastatin) .Marland Kitchen.. 1 by mouth at bedtime 10)  Flonase 50 Mcg/act Susp (Fluticasone propionate) .... 2 sprays each nostril  every morning 11)  Effexor Xr 150 Mg Xr24h-cap (Venlafaxine hcl) .Marland Kitchen.. 1 by mouth once daily  Patient Instructions: 1)  it was good to see you today. 2)  return Monday fasting for labs: lipids (272.4), liver function tests (v58.69)-  your results will be called to you in 48-72 hours from the time of test completion 3)  increase the Effexor to 150mg  at bedtime - new prescription done 4)  add Flonase to loratidine for your allergy symptoms  5)  your prescriptions have been electronically submitted to your pharmacy. Please take as directed. Contact our office if you believe you're having problems with the medication(s).  6)  refill on klonopin - use as directed (only as needed) - paper prescription given to you 7)  wear the wrist splints at bedtime while you sleep- this should help the numbness symptoms  8)  Please schedule a follow-up appointment in 3-4 months, sooner if problems.  Prescriptions: KLONOPIN 0.5 MG TABS (CLONAZEPAM) 1-2 by mouth at bedtime as needed for sleep  #30 x 1   Entered and Authorized by:   Newt Lukes MD   Signed by:   Newt Lukes MD on 09/27/2009   Method used:   Print then Give to Patient   RxID:   [id number] JYNWGNF 50 MCG/ACT SUSP (FLUTICASONE PROPIONATE) 2 sprays each nostril  every morning  #1 x 6   Entered and Authorized by:   Newt Lukes MD   Signed by:   Newt Lukes MD on 09/27/2009   Method used:   Electronically to        [facility]* (retail)       [address]       [place], [place]  [address]       Ph: [phone]       Fax: [phone]   RxID:   [id number] EFFEXOR XR 150 MG XR24H-CAP (VENLAFAXINE HCL) 1 by mouth  once daily  #30 x 6   Entered and Authorized by:   Newt Lukes MD   Signed by:  Newt Lukes MD on 09/27/2009   Method used:   Electronically to        [facility]* (retail)       [address] [place], [place]  [address]       Ph: [phone]       Fax: [phone]   RxID:   [id number]

## 2010-06-19 ENCOUNTER — Ambulatory Visit (INDEPENDENT_AMBULATORY_CARE_PROVIDER_SITE_OTHER): Payer: 59 | Admitting: Internal Medicine

## 2010-06-19 ENCOUNTER — Encounter: Payer: Self-pay | Admitting: Internal Medicine

## 2010-06-19 DIAGNOSIS — E785 Hyperlipidemia, unspecified: Secondary | ICD-10-CM

## 2010-06-19 DIAGNOSIS — M542 Cervicalgia: Secondary | ICD-10-CM

## 2010-06-19 DIAGNOSIS — F411 Generalized anxiety disorder: Secondary | ICD-10-CM

## 2010-06-19 NOTE — Progress Notes (Signed)
Summary: omeprazole  Phone Note Refill Request Message from:  Fax from Pharmacy on May 23, 2010 8:27 AM  Refills Requested: Medication #1:  OMEPRAZOLE 20 MG CPDR 1 by mouth two times a day  Method Requested: Electronic Initial call taken by: Orlan Leavens RMA,  May 23, 2010 8:27 AM    Prescriptions: OMEPRAZOLE 20 MG CPDR (OMEPRAZOLE) 1 by mouth two times a day  #60 x 5   Entered by:   Orlan Leavens RMA   Authorized by:   Newt Lukes MD   Signed by:   Orlan Leavens RMA on 05/23/2010   Method used:   Electronically to        [facility]* (retail)       [address]       [place], [place]  [address]       Ph: [phone]       Fax: [phone]   RxID:   [id number]

## 2010-06-25 NOTE — Assessment & Plan Note (Signed)
Summary: ACUTE/ PER DR Sahory Nordling/NWS   Vital Signs:  Patient profile:   55 year old female Height:      61 inches (154.94 cm) Weight:      179 pounds (81.36 kg) BMI:     33.94 O2 Sat:      96 % on Room air Temp:     98.3 degrees F (36.83 degrees C) oral Pulse rate:   80 / minute BP sitting:   110 / 82  (left arm) Cuff size:   regular  Vitals Entered By: Orlan Leavens RMA (June 19, 2010 9:18 AM)  O2 Flow:  Room air CC: not feeling well Is Patient Diabetic? No Pain Assessment Patient in pain? yes     Location: Glands Type: feel tight/swollen   Primary Care Provider:  Newt Lukes MD  CC:  not feeling well.  History of Present Illness: here for left side neck and shoulder blade pain precipitated by stress, no trauma or injury +hx same - "stress knots" onset 4 days ago, course progressively worse not improved with heating pad - not using any otc meds - no fever, no weakness  also reviewed chronic med issues: menopausal syndrome - hot flashes and flushing/sweating took HRT for 3 years following hysterectomy in 2005 - no change sx w/ or w/o med- c/o vaginal dryness and pain with intercourse, low libido since hysterectomy -  has not started estrogen cream -  taking effexor but not improved - wants to wean off same  dyslipidemia -  reports compliance with ongoing medical treatment and no changes in medication dose or frequency. denies adverse side effects related to current therapy. has increased fish oil use- no GI or mskel c/o   anxiety -onset 2007- precipitated by sudden death of her sister a/w insomnia - seldom sleeps more that 4h/night - occ daytime sleepiness +tremor when stressed, esp head and neck +palpitations; denies sadness, tearfulness or depression not using klonopin ot OTC sleep aides - occ amitrip use  allg rhinitis - taking claritin and flonase - symptoms improved less cough with controlled PND symptoms   Current Medications (verified): 1)   One-A-Day Womens Formula  Tabs (Multiple Vitamins-Calcium) .... Once Daily 2)  Fish Oil 1000 Mg Caps (Omega-3 Fatty Acids) .... 2 Pills Once Daily 3)  Vitamin D 1000 Unit Tabs (Cholecalciferol) .... Once Daily 4)  Tessalon Perles 100 Mg Caps (Benzonatate) .Marland Kitchen.. 1 By Mouth Two Times A Day X 2 Weeks, Then Every 8 Hours As Needed For Cough 5)  Omeprazole 20 Mg Cpdr (Omeprazole) .Marland Kitchen.. 1 By Mouth Two Times A Day 6)  Claritin 10 Mg Tabs (Loratadine) .Marland Kitchen.. 1 By Mouth Once Daily 7)  Premarin 0.625 Mg/gm Crea (Estrogens, Conjugated) .... Apply 2x/week 8)  Klonopin 0.5 Mg Tabs (Clonazepam) .Marland Kitchen.. 1-2 By Mouth At Bedtime As Needed For Sleep 9)  Zocor 40 Mg Tabs (Simvastatin) .Marland Kitchen.. 1 By Mouth At Bedtime 10)  Flonase 50 Mcg/act Susp (Fluticasone Propionate) .... 2 Sprays Each Nostril  Every Morning 11)  Effexor Xr 150 Mg Xr24h-Cap (Venlafaxine Hcl) .Marland Kitchen.. 1 By Mouth Once Daily 12)  Amitriptyline Hcl 10 Mg Tabs (Amitriptyline Hcl) .Marland Kitchen.. 1 By Mouth At Bedtime X 7 Days, Then Increase To 2 By Mouth At Bedtime (Or As Directed)  Allergies (verified): 1)  ! Codeine 2)  ! Morphine  Past History:  Past Medical History: dyslipidemia anxiety benign essential tremor - head/neck, mild Allergic rhinitis Anemia-NOS  Diverticulitis, hx Colonic polyps, hx    MD roster: GI -  mann  Review of Systems       The patient complains of weight gain.  The patient denies anorexia, fever, chest pain, syncope, headaches, abdominal pain, suspicious skin lesions, and difficulty walking.    Physical Exam  General:  overweight-appearing.  alert, well-developed, well-nourished, and cooperative to examination.   Neck:  supple, full ROM, no masses, no thyromegaly; no thyroid nodules or tenderness. no JVD or carotid bruits.   Lungs:  normal respiratory effort, no intercostal retractions or use of accessory muscles; normal breath sounds bilaterally - no crackles and no wheezes.    Heart:  normal rate, regular rhythm, no murmur, and no  rub. BLE without edema.  Msk:  myofascial pain due to trapezius and left paraspinal neck spasm - tender along medial scapular region but FROM neck and left shoulder - good and equal grip strength Psych:  Oriented X3, memory intact for recent and remote, normally interactive, good eye contact, not anxious appearing, not depressed appearing, and not agitated.      Impression & Recommendations:  Problem # 1:  NECK PAIN, LEFT (ICD-723.1)  muscle spasm exac by stress/tension tx with tordol now - IM done add flexeril + meloxicam for next 5 d, then as needed - erx done call if symptoms not improved with conserv med tx, sooner if worse  Her updated medication list for this problem includes:    Flexeril 10 Mg Tabs (Cyclobenzaprine hcl) .Marland Kitchen... 1 by mouth at bedtime x 5 days and two times a day as needed for muscle spasm and pain    Meloxicam 15 Mg Tabs (Meloxicam) .Marland Kitchen... 1 by mouth once daily x 5 days, then as needed for pain  Orders: Prescription Created Electronically [phone]) Ketorolac-Toradol 15mg  (J8119) Admin of Therapeutic Inj  intramuscular or subcutaneous (14782)  Problem # 2:  ANXIETY STATE, UNSPECIFIED (ICD-300.00)  Her updated medication list for this problem includes:    Klonopin 0.5 Mg Tabs (Clonazepam) .Marland Kitchen... 1-2 by mouth at bedtime as needed for sleep    Effexor Xr 75 Mg Xr24h-cap (Venlafaxine hcl) .Marland Kitchen... 1 by mouth once daily    Amitriptyline Hcl 10 Mg Tabs (Amitriptyline hcl) .Marland Kitchen... 1 by mouth at bedtime x 7 days, then increase to 2 by mouth at bedtime (or as directed)    Xanax 0.25 Mg Tabs (Alprazolam) .Marland Kitchen... 1 by mouth every 6 hours as needed for anxiety/stress  wean off effexor - new erx done ok to use xanax if needed (not using klonopin or elavil due to "hangover")- reminded BZs most effective if used only as needed   Orders: Prescription Created Electronically [phone])  Orders: Prescription Created Electronically [phone])  Problem # 3:  DYSLIPIDEMIA (ICD-272.4)  Her  updated medication list for this problem includes:    Zocor 40 Mg Tabs (Simvastatin) .Marland Kitchen... 1 by mouth at bedtime  Labs Reviewed: SGOT: 20 (09/30/2009)   SGPT: 26 (09/30/2009)   HDL:51.00 (12/30/2009), 49.60 (09/30/2009)  LDL:110 (09/30/2009), 154 (04/22/2009)  Chol:205 (12/30/2009), 183 (09/30/2009)  Trig:81.0 (12/30/2009), 119.0 (09/30/2009)  Complete Medication List: 1)  One-a-day Womens Formula Tabs (Multiple vitamins-calcium) .... Once daily 2)  Fish Oil 1000 Mg Caps (Omega-3 fatty acids) .... 2 pills once daily 3)  Vitamin D 1000 Unit Tabs (Cholecalciferol) .... Once daily 4)  Omeprazole 20 Mg Cpdr (Omeprazole) .Marland Kitchen.. 1 by mouth two times a day 5)  Claritin 10 Mg Tabs (Loratadine) .Marland Kitchen.. 1 by mouth once daily 6)  Premarin 0.625 Mg/gm Crea (Estrogens, conjugated) .... Apply 2x/week 7)  Klonopin 0.5 Mg Tabs (  Clonazepam) .Marland Kitchen.. 1-2 by mouth at bedtime as needed for sleep 8)  Zocor 40 Mg Tabs (Simvastatin) .Marland Kitchen.. 1 by mouth at bedtime 9)  Flonase 50 Mcg/act Susp (Fluticasone propionate) .... 2 sprays each nostril  every morning 10)  Effexor Xr 75 Mg Xr24h-cap (Venlafaxine hcl) .Marland Kitchen.. 1 by mouth once daily 11)  Amitriptyline Hcl 10 Mg Tabs (Amitriptyline hcl) .Marland Kitchen.. 1 by mouth at bedtime x 7 days, then increase to 2 by mouth at bedtime (or as directed) 12)  Flexeril 10 Mg Tabs (Cyclobenzaprine hcl) .Marland Kitchen.. 1 by mouth at bedtime x 5 days and two times a day as needed for muscle spasm and pain 13)  Meloxicam 15 Mg Tabs (Meloxicam) .Marland Kitchen.. 1 by mouth once daily x 5 days, then as needed for pain 14)  Xanax 0.25 Mg Tabs (Alprazolam) .Marland Kitchen.. 1 by mouth every 6 hours as needed for anxiety/stress  Patient Instructions: 1)  it was good to see you today. 2)  Tordol shot given today for pain 3)  use flexeril for muscle spasms, meloxicam for pain and xanax as needed for anxiety/stress symptoms  4)  reduce dose effexor 5)  refill on simvastatin 6)  your prescriptions have been electronically submitted or faxed to your  pharmacy. Please take as directed. Contact our office if you believe you're having problems with the medication(s).  7)  Please schedule a follow-up appointment in 3 months for physical and labs, call sooner if problems.  Prescriptions: XANAX 0.25 MG TABS (ALPRAZOLAM) 1 by mouth every 6 hours as needed for anxiety/stress  #40 x 1   Entered and Authorized by:   Newt Lukes MD   Signed by:   Newt Lukes MD on 06/19/2010   Method used:   Printed then faxed to ...       [facility]* (retail)       [address]       [place], [place]  [address]       Ph: [phone]       Fax: [phone]   RxID:   [id number] MELOXICAM 15 MG TABS (MELOXICAM) 1 by mouth once daily x 5 days, then as needed for pain  #30 x 3   Entered and Authorized by:   Newt Lukes MD   Signed by:   Newt Lukes MD on 06/19/2010   Method used:   Electronically to        [facility]* (retail)       [address]       [place], [place]  [address]       Ph: [phone]       Fax: [phone]   RxID:   [id number] FLEXERIL 10 MG TABS (CYCLOBENZAPRINE HCL) 1 by mouth at bedtime x 5 days and two times a day as needed for muscle spasm and pain  #40 x 0   Entered and Authorized by:   Newt Lukes MD   Signed by:   Newt Lukes MD on 06/19/2010   Method used:   Electronically to        [facility]* (retail)       [address]       [place], [place]  [address]       Ph: [phone]       Fax: [phone]   RxID:   [id number] ZOCOR 40 MG TABS (SIMVASTATIN) 1 by mouth at bedtime  #30 x 11  Entered and Authorized by:   Newt Lukes MD   Signed by:   Newt Lukes MD on 06/19/2010   Method used:   Electronically to        [facility]- [facility]* (retail)       [address] [place], [place]  [address]       Ph: [phone]       Fax: [phone]   RxID:   [id number] EFFEXOR XR 75 MG XR24H-CAP (VENLAFAXINE HCL) 1 by  mouth once daily  #30 x 3   Entered and Authorized by:   Newt Lukes MD   Signed by:   Newt Lukes MD on 06/19/2010   Method used:   Electronically to        [facility]* (retail)       [address]       [place], [place]  [address]       Ph: [phone]       Fax: [phone]   RxID:   [id number]    Medication Administration  Injection # 1:    Medication: Ketorolac-Toradol 15mg     Diagnosis: NECK PAIN, LEFT (ICD-723.1)    Route: IM    Site: LUOQ gluteus    Exp Date: 06/18/2010    Lot #: [id number]    Mfr: Novalpus    Comments: Gave total 60mg     Patient tolerated injection without complications    Given by: Orlan Leavens RMA (June 19, 2010 1:04 PM)  Orders Added: 1)  Est. Patient Level IV [[address]] 2)  Prescription Created Electronically [[id number]] 3)  Ketorolac-Toradol 15mg  [J1885] 4)  Admin of Therapeutic Inj  intramuscular or subcutaneous [62831]

## 2010-08-26 ENCOUNTER — Other Ambulatory Visit: Payer: Self-pay | Admitting: *Deleted

## 2010-08-26 MED ORDER — ALPRAZOLAM 0.25 MG PO TABS: 0.2500 mg | ORAL_TABLET | Freq: Four times a day (QID) | ORAL | Status: DC | PRN

## 2010-08-26 NOTE — Telephone Encounter (Signed)
Faxed paper request back to [facility]. Updated EPIC.Marland KitchenMarland Kitchen4/10/12@10 :04am/LMB

## 2010-10-29 ENCOUNTER — Encounter: Payer: Self-pay | Admitting: Internal Medicine

## 2010-12-29 ENCOUNTER — Other Ambulatory Visit: Payer: Self-pay | Admitting: *Deleted

## 2010-12-29 MED ORDER — MONTELUKAST SODIUM 10 MG PO TABS: 10.0000 mg | ORAL_TABLET | Freq: Every day | ORAL | Status: DC

## 2010-12-29 MED ORDER — FLUTICASONE PROPIONATE 50 MCG/ACT NA SUSP: 2.0000 | NASAL | Status: DC

## 2011-03-12 ENCOUNTER — Other Ambulatory Visit: Payer: Self-pay | Admitting: *Deleted

## 2011-03-12 MED ORDER — ALPRAZOLAM 0.25 MG PO TABS: 0.2500 mg | ORAL_TABLET | Freq: Four times a day (QID) | ORAL | Status: DC | PRN

## 2011-03-13 NOTE — Telephone Encounter (Signed)
Faxed script back to [facility] pharmacy @ [phone]...03/13/11@9 :07am/LMB

## 2011-06-17 ENCOUNTER — Encounter: Payer: Self-pay | Admitting: Internal Medicine

## 2011-06-17 ENCOUNTER — Ambulatory Visit (INDEPENDENT_AMBULATORY_CARE_PROVIDER_SITE_OTHER): Payer: 59 | Admitting: Internal Medicine

## 2011-06-17 VITALS — BP 110/84 | HR 82 | Temp 97.3°F

## 2011-06-17 DIAGNOSIS — F411 Generalized anxiety disorder: Secondary | ICD-10-CM

## 2011-06-17 DIAGNOSIS — J069 Acute upper respiratory infection, unspecified: Secondary | ICD-10-CM

## 2011-06-17 DIAGNOSIS — H6640 Suppurative otitis media, unspecified, unspecified ear: Secondary | ICD-10-CM

## 2011-06-17 DIAGNOSIS — E785 Hyperlipidemia, unspecified: Secondary | ICD-10-CM

## 2011-06-17 MED ORDER — HYDROCODONE-HOMATROPINE 5-1.5 MG PO TABS: 1.0000 | ORAL_TABLET | ORAL | Status: DC | PRN

## 2011-06-17 MED ORDER — BENZONATATE 200 MG PO CAPS
200.0000 mg | ORAL_CAPSULE | Freq: Three times a day (TID) | ORAL | Status: AC | PRN
Start: 2011-06-17 — End: 2011-06-24

## 2011-06-17 MED ORDER — AMOXICILLIN-POT CLAVULANATE 875-125 MG PO TABS: 1.0000 | ORAL_TABLET | Freq: Two times a day (BID) | ORAL | Status: DC

## 2011-06-17 NOTE — Assessment & Plan Note (Signed)
Prev rx'd simva 40 qd but noncompliant with same due to myalgias Takes omega 3/fish oil Encouraged to work on diet/exercise and return for CPX to recheck and manage as needed, esp with hx FH CAD

## 2011-06-17 NOTE — Progress Notes (Signed)
  Subjective:    HPI  complains of L ear pain and head cold symptoms  Onset >1 week ago, progressively worse symptoms  Initially associated with rhinorrhea, sneezing, sore throat, mild headache and low grade fever Also myalgias, sinus pressure and mild-mod L>R ear pain x 3 days No relief with OTC meds Precipitated by sick contacts  Past Medical History  Diagnosis Date  . ALLERGIC RHINITIS   . ANEMIA-NOS   . Anxiety state, unspecified   . ARTHRITIS   . CARPAL TUNNEL SYNDROME, BILATERAL   . COLONIC POLYPS, HX OF   . DIVERTICULOSIS OF COLON   . DYSLIPIDEMIA   . MIGRAINE HEADACHE   . POSTMENOPAUSAL SYNDROME   . TREMOR, ESSENTIAL   . URINARY INCONTINENCE   . VARICOSE VEINS, LOWER EXTREMITIES, MILD     Review of Systems Constitutional: No night sweats, no unexpected weight change Pulmonary: No pleurisy or hemoptysis Cardiovascular: No chest pain or palpitations     Objective:   Physical Exam BP 110/84  Pulse 82  Temp(Src) 97.3 F (36.3 C) (Oral)  SpO2 97% GEN: mildly ill appearing and audible head/chest congestion HENT: NCAT, mild sinus tenderness bilaterally, nares with clear discharge, oropharynx mild erythema, no exudate; L TM with redness and suppurative effusion Eyes: Vision grossly intact, no conjunctivitis Lungs: Clear to auscultation without rhonchi or wheeze, no increased work of breathing Cardiovascular: Regular rate and rhythm, no bilateral edema      Assessment & Plan:  L otitis media  Cough   Empiric antibiotics prescribed  Prescription cough suppression - new prescriptions done Symptomatic care with Mucinex D, Tylenol or Advil, hydration and rest -

## 2011-06-17 NOTE — Patient Instructions (Addendum)
It was good to see you today. You have an ear infection - Augmentin antibiotics  Use hycodan or tessalon for cough symptoms as needed Your prescription(s) have been submitted to your pharmacy. Please take as directed and contact our office if you believe you are having problem(s) with the medication(s). Pick up Mucinex D for congestion and cough symptoms  Please schedule followup in 3-4 months to discuss cholesterol, call sooner if problems.

## 2011-06-17 NOTE — Assessment & Plan Note (Signed)
Uses xanax prn - no longer taking effexor as "didn't help anxiety or hot flash symptoms" exac by family stress/illness and temporary unemployment summer-fall 2012

## 2011-06-18 ENCOUNTER — Telehealth: Payer: Self-pay | Admitting: *Deleted

## 2011-06-18 MED ORDER — AMOXICILLIN-POT CLAVULANATE 875-125 MG PO TABS: 1.0000 | ORAL_TABLET | Freq: Two times a day (BID) | ORAL | Status: AC

## 2011-06-18 NOTE — Telephone Encounter (Signed)
Pt sent md a msg stating pharmacy never received antibiotic rx yesterday. Resending  To [facility]...06/18/11@10 :41am/LMB

## 2011-06-22 ENCOUNTER — Telehealth: Payer: Self-pay | Admitting: Internal Medicine

## 2011-06-22 DIAGNOSIS — H6690 Otitis media, unspecified, unspecified ear: Secondary | ICD-10-CM

## 2011-06-22 DIAGNOSIS — H9209 Otalgia, unspecified ear: Secondary | ICD-10-CM

## 2011-06-22 DIAGNOSIS — H9190 Unspecified hearing loss, unspecified ear: Secondary | ICD-10-CM

## 2011-06-22 MED ORDER — ANTIPYRINE-BENZOCAINE 5.4-1.4 % OT SOLN: 3.0000 [drp] | OTIC | Status: AC | PRN

## 2011-06-22 MED ORDER — HYDROCODONE-ACETAMINOPHEN 5-500 MG PO TABS: 1.0000 | ORAL_TABLET | Freq: Three times a day (TID) | ORAL | Status: AC | PRN

## 2011-06-22 NOTE — Telephone Encounter (Signed)
Pt complains of continued and worsening ear pain with decreased hearing L side, now affecting R side also - taking abx - will refer to ENT and send pain meds/pain ear drops to pharmacy

## 2011-06-22 NOTE — Telephone Encounter (Signed)
A user error has taken place: encounter opened in error, closed for administrative reasons.

## 2011-09-16 ENCOUNTER — Ambulatory Visit: Payer: 59 | Admitting: Internal Medicine

## 2012-02-26 ENCOUNTER — Other Ambulatory Visit: Payer: Self-pay | Admitting: *Deleted

## 2012-02-26 MED ORDER — FLUTICASONE PROPIONATE 50 MCG/ACT NA SUSP: 2.0000 | NASAL | Status: DC

## 2012-02-26 NOTE — Telephone Encounter (Signed)
R'cd fax from [facility] for refill of Flonase.

## 2012-07-28 ENCOUNTER — Telehealth: Payer: Self-pay | Admitting: Emergency Medicine

## 2014-12-03 ENCOUNTER — Ambulatory Visit (INDEPENDENT_AMBULATORY_CARE_PROVIDER_SITE_OTHER): Payer: BLUE CROSS/BLUE SHIELD

## 2014-12-03 ENCOUNTER — Encounter: Payer: Self-pay | Admitting: Sports Medicine

## 2014-12-03 ENCOUNTER — Ambulatory Visit (INDEPENDENT_AMBULATORY_CARE_PROVIDER_SITE_OTHER): Payer: BLUE CROSS/BLUE SHIELD | Admitting: Sports Medicine

## 2014-12-03 VITALS — BP 120/82 | HR 76 | Ht 61.0 in | Wt 180.0 lb

## 2014-12-03 DIAGNOSIS — M722 Plantar fascial fibromatosis: Secondary | ICD-10-CM | POA: Diagnosis not present

## 2014-12-03 DIAGNOSIS — M25471 Effusion, right ankle: Secondary | ICD-10-CM | POA: Diagnosis not present

## 2014-12-03 DIAGNOSIS — M25571 Pain in right ankle and joints of right foot: Secondary | ICD-10-CM | POA: Diagnosis not present

## 2014-12-03 DIAGNOSIS — S92351D Displaced fracture of fifth metatarsal bone, right foot, subsequent encounter for fracture with routine healing: Secondary | ICD-10-CM | POA: Insufficient documentation

## 2014-12-03 MED ORDER — HYDROCODONE-ACETAMINOPHEN 5-325 MG PO TABS: 1.0000 | ORAL_TABLET | Freq: Three times a day (TID) | ORAL | Status: DC | PRN

## 2014-12-03 NOTE — Assessment & Plan Note (Addendum)
Gel cups of the left, if no fracture in the ankle I can inject her plantar fascia, this is traumatic post cutting the grass. Ultimately have her return for custom orthotics. No visible fractures that are new, plantar fascia injection as above, return for custom orthotics.

## 2014-12-03 NOTE — Progress Notes (Signed)
   Subjective:    I'm seeing this patient as a consultation for:  Dr. Rene PaciValerie Leschber  CC: left heel pain, right ankle pain  HPI: Yesterday this pleasant 59 year old female slipped and rolled her right ankle. She has pain, swelling, bruising, she was able to bear some weight, pain is moderate, persistent and localized at the lateral malleolus.  Left ankle pain: Occurred after mowing a lawn for 3 hours, pain is localized at the plantar aspect of the heel bone. Severe, persistent without radiation.  Past medical history, Surgical history, Family history not pertinant except as noted below, Social history, Allergies, and medications have been entered into the medical record, reviewed, and no changes needed.   Review of Systems: No headache, visual changes, nausea, vomiting, diarrhea, constipation, dizziness, abdominal pain, skin rash, fevers, chills, night sweats, weight loss, swollen lymph nodes, body aches, joint swelling, muscle aches, chest pain, shortness of breath, mood changes, visual or auditory hallucinations.   Objective:   General: Well Developed, well nourished, and in no acute distress.  Neuro/Psych: Alert and oriented x3, extra-ocular muscles intact, able to move all 4 extremities, sensation grossly intact. Skin: Warm and dry, no rashes noted.  Respiratory: Not using accessory muscles, speaking in full sentences, trachea midline.  Cardiovascular: Pulses palpable, no extremity edema. Abdomen: Does not appear distended. Left Foot: No visible erythema or swelling. Range of motion is full in all directions. Strength is 5/5 in all directions. No hallux valgus. No pes cavus or pes planus. No abnormal callus noted. No pain over the navicular prominence, or base of fifth metatarsal. Tender to palpation at the calcaneal insertion of plantar fascia. No pain at the Achilles insertion. No pain over the calcaneal bursa. No pain of the retrocalcaneal bursa. No tenderness to palpation  over the tarsals, metatarsals, or phalanges. No hallux rigidus or limitus. No tenderness palpation over interphalangeal joints. No pain with compression of the metatarsal heads. Neurovascularly intact distally. Right Ankle: Visibly swollen and bruised with tenderness over the lateral malleolus Range of motion is full in all directions. Strength is 5/5 in all directions. Stable lateral and medial ligaments; squeeze test and kleiger test unremarkable; Talar dome nontender; No pain at base of 5th MT; No tenderness over cuboid; No tenderness over N spot or navicular prominence No sign of peroneal tendon subluxations; Negative tarsal tunnel tinel's  X-rays reviewed and show evidence of an old tibial fracture, and an old lateral malleolus fracture that is well corticated.  Procedure: Real-time Ultrasound Guided Injection of left plantar fascial origin Device: GE Logiq E  Verbal informed consent obtained.  Time-out conducted.  Noted no overlying erythema, induration, or other signs of local infection.  Skin prepped in a sterile fashion.  Local anesthesia: Topical Ethyl chloride.  With sterile technique and under real time ultrasound guidance:  25-gauge needle advanced to the origin of the plantar fascia, 1 mL kenalog 40, 2 mL lidocaine injected easily. Completed without difficulty  Pain immediately resolved suggesting accurate placement of the medication.  Advised to call if fevers/chills, erythema, induration, drainage, or persistent bleeding.  Images permanently stored and available for review in the ultrasound unit.  Impression: Technically successful ultrasound guided injection.  Impression and Recommendations:   This case required medical decision making of moderate complexity.

## 2014-12-03 NOTE — Assessment & Plan Note (Addendum)
Question fracture. CAM boot, Vicodin, if no fracture we can inject the plantar fascia on the left. X-ray does not show fracture, continue boot, this likely represents a simple sprain.

## 2014-12-24 ENCOUNTER — Ambulatory Visit (INDEPENDENT_AMBULATORY_CARE_PROVIDER_SITE_OTHER): Payer: BLUE CROSS/BLUE SHIELD | Admitting: Sports Medicine

## 2014-12-24 DIAGNOSIS — M722 Plantar fascial fibromatosis: Secondary | ICD-10-CM | POA: Diagnosis not present

## 2014-12-24 MED ORDER — TRAMADOL HCL 50 MG PO TABS: ORAL_TABLET | ORAL | Status: DC

## 2014-12-24 NOTE — Assessment & Plan Note (Signed)
Worsening pain after injection. Adding tramadol, custom orthotics as above. Return in one month.

## 2014-12-24 NOTE — Progress Notes (Signed)

## 2015-01-22 ENCOUNTER — Ambulatory Visit: Payer: BLUE CROSS/BLUE SHIELD | Admitting: Sports Medicine

## 2015-09-17 DIAGNOSIS — Z0000 Encounter for general adult medical examination without abnormal findings: Secondary | ICD-10-CM | POA: Diagnosis not present

## 2015-09-17 DIAGNOSIS — J301 Allergic rhinitis due to pollen: Secondary | ICD-10-CM | POA: Diagnosis not present

## 2015-09-17 DIAGNOSIS — Z136 Encounter for screening for cardiovascular disorders: Secondary | ICD-10-CM | POA: Diagnosis not present

## 2015-09-24 DIAGNOSIS — Z0000 Encounter for general adult medical examination without abnormal findings: Secondary | ICD-10-CM | POA: Diagnosis not present

## 2015-09-24 DIAGNOSIS — Z23 Encounter for immunization: Secondary | ICD-10-CM | POA: Diagnosis not present

## 2015-09-24 DIAGNOSIS — Z1211 Encounter for screening for malignant neoplasm of colon: Secondary | ICD-10-CM | POA: Diagnosis not present

## 2015-09-24 DIAGNOSIS — Z1159 Encounter for screening for other viral diseases: Secondary | ICD-10-CM | POA: Diagnosis not present

## 2016-05-30 ENCOUNTER — Emergency Department
Admission: EM | Admit: 2016-05-30 | Discharge: 2016-05-30 | Disposition: A | Discharge status: Home / Self Care | Payer: BLUE CROSS/BLUE SHIELD | Source: Home / Self Care | Attending: Family Medicine | Admitting: Family Medicine

## 2016-05-30 ENCOUNTER — Encounter: Payer: Self-pay | Admitting: Emergency Medicine

## 2016-05-30 DIAGNOSIS — R6889 Other general symptoms and signs: Secondary | ICD-10-CM

## 2016-05-30 DIAGNOSIS — R110 Nausea: Secondary | ICD-10-CM

## 2016-05-30 DIAGNOSIS — Z20828 Contact with and (suspected) exposure to other viral communicable diseases: Secondary | ICD-10-CM

## 2016-05-30 MED ORDER — ONDANSETRON HCL 4 MG PO TABS: 4.0000 mg | ORAL_TABLET | Freq: Four times a day (QID) | ORAL | 0 refills | Status: DC

## 2016-05-30 MED ORDER — OSELTAMIVIR PHOSPHATE 75 MG PO CAPS: 75.0000 mg | ORAL_CAPSULE | Freq: Two times a day (BID) | ORAL | 0 refills | Status: DC

## 2016-05-30 NOTE — ED Provider Notes (Signed)
CSN: [id number]     Arrival date & time 05/30/16  1336 History   First MD Initiated Contact with Patient 05/30/16 1348     Chief Complaint  Patient presents with  . URI   (Consider location/radiation/quality/duration/timing/severity/associated sxs/prior Treatment) HPI  Gabriel RungSusan Woolston is a 61 y.o. female presenting to UC with c/o sudden onset body aches, chills, congestion and tenderness to lymph node behind Right ear. She notes her husband was in UC about 3 days ago for flu symptoms and another 20 people at work have been sick. She does not get the flu vaccine as "every time" she got the vaccine she got the flu.  Denies known fever. She has had nausea but no vomiting or diarrhea.    Past Medical History:  Diagnosis Date  . ALLERGIC RHINITIS   . ANEMIA-NOS   . Anxiety state, unspecified   . ARTHRITIS   . CARPAL TUNNEL SYNDROME, BILATERAL   . Chest pain, atypical 09/11/2009   neg nuc stress test  . COLONIC POLYPS, HX OF 06/2009  . DIVERTICULOSIS OF COLON   . DYSLIPIDEMIA   . MIGRAINE HEADACHE   . POSTMENOPAUSAL SYNDROME   . TREMOR, ESSENTIAL   . URINARY INCONTINENCE   . VARICOSE VEINS, LOWER EXTREMITIES, MILD    Past Surgical History:  Procedure Laterality Date  . ABDOMINAL HYSTERECTOMY  2004  . Broken leg  1967   had blood clot  . REPLACEMENT TOTAL KNEE  2008   Family History  Problem Relation Age of Onset  . Arthritis Father   . Hypertension Sister   . Hyperlipidemia Sister   . Coronary artery disease Sister   . Lung cancer Sister   . Arthritis Mother   . Alcohol abuse Other     Parents & grandparents  . Breast cancer Other   . Colon cancer Other   . Hyperlipidemia Other     Parent, grandparent  . Hypertension Other   . Ovarian cancer Other   . Stroke Other    Social History  Substance Use Topics  . Smoking status: Never Smoker  . Smokeless tobacco: Never Used     Comment: Married, lives with spouse.  Marland Kitchen. Alcohol use Yes     Comment: Occassional   OB History     No data available     Review of Systems  Constitutional: Positive for chills. Negative for fever.  HENT: Positive for congestion, ear pain (Right), rhinorrhea and sore throat. Negative for postnasal drip, trouble swallowing and voice change.   Respiratory: Positive for cough. Negative for shortness of breath.   Cardiovascular: Negative for chest pain and palpitations.  Gastrointestinal: Positive for nausea. Negative for abdominal pain, diarrhea and vomiting.  Musculoskeletal: Positive for arthralgias and myalgias. Negative for back pain.       Body aches  Skin: Negative for rash.  Neurological: Positive for headaches. Negative for dizziness and light-headedness.  All other systems reviewed and are negative.   Allergies  Codeine; Latex; and Morphine  Home Medications   Prior to Admission medications   Medication Sig Start Date End Date Taking? Authorizing Provider  ondansetron (ZOFRAN) 4 MG tablet Take 1 tablet (4 mg total) by mouth every 6 (six) hours. 05/30/16   Junius FinnerErin O'Malley, PA-C  oseltamivir (TAMIFLU) 75 MG capsule Take 1 capsule (75 mg total) by mouth every 12 (twelve) hours. 05/30/16   Junius FinnerErin O'Malley, PA-C   Meds Ordered and Administered this Visit  Medications - No data to display  BP 109/77 (BP Location:  Left Arm)   Pulse 94   Temp 98.4 F (36.9 C) (Oral)   Ht 5\' 1"  (1.549 m)   Wt 175 lb (79.4 kg)   SpO2 97%   BMI 33.07 kg/m  No data found.   Physical Exam  Constitutional: She is oriented to person, place, and time. She appears well-developed and well-nourished. No distress.  HENT:  Head: Normocephalic and atraumatic.  Right Ear: Tympanic membrane normal.  Left Ear: Tympanic membrane normal.  Nose: Mucosal edema present.  Mouth/Throat: Uvula is midline, oropharynx is clear and moist and mucous membranes are normal.  Eyes: EOM are normal.  Neck: Normal range of motion. Neck supple.  Cardiovascular: Normal rate and regular rhythm.   Pulmonary/Chest: Effort  normal and breath sounds normal. No stridor. No respiratory distress. She has no wheezes. She has no rales.  Musculoskeletal: Normal range of motion.  Lymphadenopathy:    She has cervical adenopathy.  Neurological: She is alert and oriented to person, place, and time.  Essential tremor on exam. Pt alert. Speech is clear.  Skin: Skin is warm and dry. She is not diaphoretic.  Psychiatric: She has a normal mood and affect. Her behavior is normal.  Nursing note and vitals reviewed.   Urgent Care Course   Clinical Course     Procedures (including critical care time)  Labs Review Labs Reviewed - No data to display  Imaging Review No results found.  MDM   1. Flu-like symptoms   2. Exposure to influenza   3. Nausea    Pt presenting with sudden onset flu-like symptoms since yesterday. Known exposure to the flu.  No evidence of underlying bacterial infection at this time. Discussed risks/benefits of Tamiflu. Pt would like to try the treatment. Rx: Tamiflu and Zofran F/u with PCP later this week if not improving. Patient verbalized understanding and agreement with treatment plan.      Junius Finner, PA-C 05/30/16 [phone]

## 2016-05-30 NOTE — ED Triage Notes (Signed)
Pt c/o severe sinus infection vs. Flu, body aches, lymph node behind right ear is swollen

## 2016-12-14 ENCOUNTER — Ambulatory Visit (INDEPENDENT_AMBULATORY_CARE_PROVIDER_SITE_OTHER): Payer: BLUE CROSS/BLUE SHIELD

## 2016-12-14 ENCOUNTER — Ambulatory Visit (INDEPENDENT_AMBULATORY_CARE_PROVIDER_SITE_OTHER): Payer: BLUE CROSS/BLUE SHIELD | Admitting: Sports Medicine

## 2016-12-14 ENCOUNTER — Encounter: Payer: Self-pay | Admitting: Sports Medicine

## 2016-12-14 DIAGNOSIS — S82831A Other fracture of upper and lower end of right fibula, initial encounter for closed fracture: Secondary | ICD-10-CM | POA: Diagnosis not present

## 2016-12-14 DIAGNOSIS — M7989 Other specified soft tissue disorders: Secondary | ICD-10-CM | POA: Diagnosis not present

## 2016-12-14 DIAGNOSIS — M25571 Pain in right ankle and joints of right foot: Secondary | ICD-10-CM

## 2016-12-14 DIAGNOSIS — S9031XA Contusion of right foot, initial encounter: Secondary | ICD-10-CM | POA: Diagnosis not present

## 2016-12-14 MED ORDER — HYDROCODONE-ACETAMINOPHEN 5-325 MG PO TABS: 1.0000 | ORAL_TABLET | Freq: Three times a day (TID) | ORAL | 0 refills | Status: DC | PRN

## 2016-12-14 NOTE — Progress Notes (Signed)
   Subjective:    I'm seeing this patient as a consultation for: Dr. Harvie HeckMishi Jackson.  CC: ankle pain  HPI: Patient reports falling and rolling her right ankle 2 days ago. She reports swelling and pain immediately following injury. Patient is able to walk on ankle, but this reproduces the pain. Patient reports icing and taking ibuprofen and tylenol which helped alleviate some of the pain. At the time of injury patient states pain was 10/10 in severity, now it is a 3/10.    Past medical history, Surgical history, Family history not pertinant except as noted below, Social history, Allergies, and medications have been entered into the medical record, reviewed, and no changes needed.   Review of Systems: No headache, visual changes, nausea, vomiting, diarrhea, constipation, dizziness, abdominal pain, skin rash, fevers, chills, night sweats, weight loss, swollen lymph nodes, body aches, joint swelling, muscle aches, chest pain, shortness of breath, mood changes, visual or auditory hallucinations.   Objective:   General: Well Developed, well nourished, and in no acute distress.  Neuro:  Extra-ocular muscles intact, able to move all 4 extremities, sensation grossly intact.  Deep tendon reflexes tested were normal. Psych: Alert and oriented, mood congruent with affect. ENT:  Ears and nose appear unremarkable.  Hearing grossly normal. Neck: Unremarkable overall appearance, trachea midline.  No visible thyroid enlargement. Eyes: Conjunctivae and lids appear unremarkable.  Pupils equal and round. Skin: Warm and dry, no rashes noted.  Cardiovascular: Pulses palpable, no extremity edema. MSK: Right ankle: Ankle is noticeably swollen with discoloration surrounding lateral malleolus  Tender to palpation over lateral malleolus and at base of fifth metatarsal Range of motion is limited by pain, dorsiflexion, plantarflexion, eversion and inversion are all markedly decreased Strength 4/5 with dorsiflexion and  plantarflexion   Impression and Recommendations:   This case required medical decision making of moderate complexity.  61 year-old female with right ankle pain. Given the patient's mechanism of injury, physical exam and severity of pain, ankle and foot x-rays were ordered. X-rays demonstrated possible fracture at distal fibula and possible avulsion fracture at distal fifth metatarsal. A follow-up CT of foot and ankle was ordered to further assess. Patient will wear boot and follow-up in 2 weeks.

## 2016-12-14 NOTE — Assessment & Plan Note (Addendum)
Repeat injury, x-rays.  X-ray show what appear to be a avulsion fracture from the base of the fifth metatarsal as well as a intra-articular fracture of the lateral malleolus. Strap with compressive dressing, CAM boot immobilization, hydrocodone for pain, I do need a CT for reconstruction considering intra-articular component.

## 2016-12-15 DIAGNOSIS — M7989 Other specified soft tissue disorders: Secondary | ICD-10-CM | POA: Diagnosis not present

## 2016-12-24 ENCOUNTER — Ambulatory Visit (INDEPENDENT_AMBULATORY_CARE_PROVIDER_SITE_OTHER): Payer: BLUE CROSS/BLUE SHIELD | Admitting: Sports Medicine

## 2016-12-24 ENCOUNTER — Encounter: Payer: Self-pay | Admitting: Sports Medicine

## 2016-12-24 DIAGNOSIS — M19071 Primary osteoarthritis, right ankle and foot: Secondary | ICD-10-CM | POA: Diagnosis not present

## 2016-12-24 DIAGNOSIS — M19072 Primary osteoarthritis, left ankle and foot: Secondary | ICD-10-CM

## 2016-12-24 DIAGNOSIS — S92351D Displaced fracture of fifth metatarsal bone, right foot, subsequent encounter for fracture with routine healing: Secondary | ICD-10-CM | POA: Diagnosis not present

## 2016-12-24 NOTE — Assessment & Plan Note (Addendum)
Suspected fracture right ankle but CT scan confirmed simple osteoarthritis. With her fifth metatarsal fracture is healed I am going to inject both of her ankle joints. The right ankle was strapped with compressive dressing.

## 2016-12-24 NOTE — Progress Notes (Signed)
  Subjective:    CC: Follow-up  HPI: This is a pleasant 61 year old female, she inverted her right ankle, x-ray showed what appeared to be an intra-articular fracture of the ankle, as well as a fracture at the base of the fifth metatarsal. We obtained a CT for further clarification which showed that there was in fact no fracture in the ankle, but simple osteoarthritis, I placed her in a boot, and she returns today for follow-up. She still has some tenderness at the base of the fifth metatarsal, overall her pain is much better.  Past medical history:  Negative.  See flowsheet/record as well for more information.  Surgical history: Negative.  See flowsheet/record as well for more information.  Family history: Negative.  See flowsheet/record as well for more information.  Social history: Negative.  See flowsheet/record as well for more information.  Allergies, and medications have been entered into the medical record, reviewed, and no changes needed.   Review of Systems: No fevers, chills, night sweats, weight loss, chest pain, or shortness of breath.   Objective:    General: Well Developed, well nourished, and in no acute distress.  Neuro: Alert and oriented x3, extra-ocular muscles intact, sensation grossly intact.  HEENT: Normocephalic, atraumatic, pupils equal round reactive to light, neck supple, no masses, no lymphadenopathy, thyroid nonpalpable.  Skin: Warm and dry, no rashes. Cardiac: Regular rate and rhythm, no murmurs rubs or gallops, no lower extremity edema.  Respiratory: Clear to auscultation bilaterally. Not using accessory muscles, speaking in full sentences. Right Ankle: Minimal tenderness over the tibiotalar joint Range of motion is full in all directions. Strength is 5/5 in all directions. Stable lateral and medial ligaments; squeeze test and kleiger test unremarkable; Talar dome nontender; No tenderness over cuboid; No tenderness over N spot or navicular prominence No  tenderness on posterior aspects of lateral and medial malleolus No sign of peroneal tendon subluxations; Negative tarsal tunnel tinel's Able to walk 4 steps. Moderate tenderness at the base of the fifth metatarsal.  The ankle was again strapped with compressive dressing.  Impression and Recommendations:    Closed fracture of base of fifth metatarsal bone with routine healing, right Improving considerably, continue boot for 3 more weeks and then return to see me. 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  Primary osteoarthritis of both ankles Suspected fracture right ankle but CT scan confirmed simple osteoarthritis. With her fifth metatarsal fracture is healed I am going to inject both of her ankle joints. The right ankle was strapped with compressive dressing.

## 2016-12-24 NOTE — Assessment & Plan Note (Signed)
Improving considerably, continue boot for 3 more weeks and then return to see me. 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

## 2016-12-28 ENCOUNTER — Ambulatory Visit: Payer: BLUE CROSS/BLUE SHIELD | Admitting: Sports Medicine

## 2016-12-31 ENCOUNTER — Ambulatory Visit: Payer: BLUE CROSS/BLUE SHIELD | Admitting: Sports Medicine

## 2017-01-14 ENCOUNTER — Ambulatory Visit (INDEPENDENT_AMBULATORY_CARE_PROVIDER_SITE_OTHER): Payer: BLUE CROSS/BLUE SHIELD | Admitting: Sports Medicine

## 2017-01-14 ENCOUNTER — Encounter: Payer: Self-pay | Admitting: Sports Medicine

## 2017-01-14 DIAGNOSIS — M19072 Primary osteoarthritis, left ankle and foot: Secondary | ICD-10-CM

## 2017-01-14 DIAGNOSIS — M19071 Primary osteoarthritis, right ankle and foot: Secondary | ICD-10-CM

## 2017-01-14 DIAGNOSIS — S92351D Displaced fracture of fifth metatarsal bone, right foot, subsequent encounter for fracture with routine healing: Secondary | ICD-10-CM

## 2017-01-14 NOTE — Progress Notes (Signed)
Subjective:    CC: Follow-up  HPI: Right foot and ankle pain: We have been treating a right fifth metatarsal base fracture, she's done really well, in the boot. She tells she has no pain at the base of the fifth metatarsal. Unfortunately she continues to have pain over her tibiotalar joint, a CT scan showed tibiotalar joint osteoarthritis with intra-articular loose bodies and no evidence of fracture. Pain is moderate, persistent at the tibiotalar joint. She is eager to get out of the boot.  Past medical history:  Negative.  See flowsheet/record as well for more information.  Surgical history: Negative.  See flowsheet/record as well for more information.  Family history: Negative.  See flowsheet/record as well for more information.  Social history: Negative.  See flowsheet/record as well for more information.  Allergies, and medications have been entered into the medical record, reviewed, and no changes needed.   Review of Systems: No fevers, chills, night sweats, weight loss, chest pain, or shortness of breath.   Objective:    General: Well Developed, well nourished, and in no acute distress.  Neuro: Alert and oriented x3, extra-ocular muscles intact, sensation grossly intact.  HEENT: Normocephalic, atraumatic, pupils equal round reactive to light, neck supple, no masses, no lymphadenopathy, thyroid nonpalpable.  Skin: Warm and dry, no rashes. Cardiac: Regular rate and rhythm, no murmurs rubs or gallops, no lower extremity edema.  Respiratory: Clear to auscultation bilaterally. Not using accessory muscles, speaking in full sentences. Right Ankle: No visible erythema or swelling. Range of motion is full in all directions. Strength is 5/5 in all directions. Stable lateral and medial ligaments; squeeze test and kleiger test unremarkable; Tender to palpation of the medial talar dome No pain at base of 5th MT; No tenderness over cuboid; No tenderness over N spot or navicular prominence No  tenderness on posterior aspects of lateral and medial malleolus No sign of peroneal tendon subluxations; Negative tarsal tunnel tinel's Able to walk 4 steps. Right Foot: No visible erythema or swelling. Range of motion is full in all directions. Strength is 5/5 in all directions. No hallux valgus. No pes cavus or pes planus. No abnormal callus noted. No pain over the navicular prominence, or base of fifth metatarsal. No tenderness to palpation of the calcaneal insertion of plantar fascia. No pain at the Achilles insertion. No pain over the calcaneal bursa. No pain of the retrocalcaneal bursa. No tenderness to palpation over the tarsals, metatarsals, or phalanges. No hallux rigidus or limitus. No tenderness palpation over interphalangeal joints. No pain with compression of the metatarsal heads. Neurovascularly intact distally.  Procedure: Real-time Ultrasound Guided Injection of right tibiotalar joint Device: GE Logiq E  Verbal informed consent obtained.  Time-out conducted.  Noted no overlying erythema, induration, or other signs of local infection.  Skin prepped in a sterile fashion.  Local anesthesia: Topical Ethyl chloride.  With sterile technique and under real time ultrasound guidance:  25-gauge needle advanced into the joint and I injected 1 mL kenalog 40, 1 mL lidocaine, 1 mL bupivacaine. Completed without difficulty  Pain immediately resolved suggesting accurate placement of the medication.  Advised to call if fevers/chills, erythema, induration, drainage, or persistent bleeding.  Images permanently stored and available for review in the ultrasound unit.  Impression: Technically successful ultrasound guided injection.  Impression and Recommendations:    Primary osteoarthritis of both ankles Fifth metatarsal fracture is clinically healed, right ankle injected, she does have some intra-articular loose bodies seen on CT. At good response to the injection  we can avoid  ankle arthroscopy.  Return in one month.  Closed fracture of base of fifth metatarsal bone with routine healing, right Clinically healed. Discontinue boot.

## 2017-01-14 NOTE — Assessment & Plan Note (Addendum)
Fifth metatarsal fracture is clinically healed, right ankle injected, she does have some intra-articular loose bodies seen on CT. At good response to the injection we can avoid ankle arthroscopy.  Return in one month.

## 2017-01-14 NOTE — Assessment & Plan Note (Signed)
Clinically healed. Discontinue boot.

## 2017-02-11 ENCOUNTER — Ambulatory Visit: Payer: BLUE CROSS/BLUE SHIELD | Admitting: Sports Medicine

## 2017-03-05 ENCOUNTER — Encounter: Payer: Self-pay | Admitting: *Deleted

## 2017-03-05 ENCOUNTER — Emergency Department
Admission: EM | Admit: 2017-03-05 | Discharge: 2017-03-05 | Disposition: A | Discharge status: Home / Self Care | Payer: BLUE CROSS/BLUE SHIELD | Source: Home / Self Care | Attending: Family Medicine | Admitting: Family Medicine

## 2017-03-05 DIAGNOSIS — J019 Acute sinusitis, unspecified: Secondary | ICD-10-CM | POA: Diagnosis not present

## 2017-03-05 DIAGNOSIS — R197 Diarrhea, unspecified: Secondary | ICD-10-CM

## 2017-03-05 DIAGNOSIS — R42 Dizziness and giddiness: Secondary | ICD-10-CM

## 2017-03-05 DIAGNOSIS — R112 Nausea with vomiting, unspecified: Secondary | ICD-10-CM

## 2017-03-05 MED ORDER — DEXAMETHASONE SODIUM PHOSPHATE 10 MG/ML IJ SOLN
10.0000 mg | Freq: Once | INTRAMUSCULAR | Status: AC
  Administered 2017-03-05: 10 mg via INTRAMUSCULAR

## 2017-03-05 MED ORDER — MECLIZINE HCL 25 MG PO TABS: 25.0000 mg | ORAL_TABLET | Freq: Three times a day (TID) | ORAL | 0 refills | Status: DC | PRN

## 2017-03-05 MED ORDER — AMOXICILLIN-POT CLAVULANATE 875-125 MG PO TABS: 1.0000 | ORAL_TABLET | Freq: Two times a day (BID) | ORAL | 0 refills | Status: DC

## 2017-03-05 NOTE — ED Provider Notes (Signed)
[facility]    CSN: [id number] Arrival date & time: 03/05/17  0900     History   Chief Complaint Chief Complaint  Patient presents with  . Dizziness    HPI Theresa Jones is a 61 y.o. female.   HPI Theresa Jones is a 61 y.o. female presenting to UC with c/o 1 week of nasal congestion and bilateral ear fullness.  Early this morning around 2:25AM she woke up feeling dizzy "like the room was spinning" and nauseated. She had an episode of vomiting and diarrhea along with a second episode of vomiting and diarrhea around 5AM.  She does not feel nauseated now but still feels dizzy. Hx of sinus infections in the past. Her husband has also been sick over the last week with URI symptoms. No fever or chills. No recent travel. Denies one sided weakness or numbness. No prior hx of vertigo.    Past Medical History:  Diagnosis Date  . ALLERGIC RHINITIS   . ANEMIA-NOS   . Anxiety state, unspecified   . ARTHRITIS   . CARPAL TUNNEL SYNDROME, BILATERAL   . Chest pain, atypical 09/11/2009   neg nuc stress test  . COLONIC POLYPS, HX OF 06/2009  . DIVERTICULOSIS OF COLON   . DYSLIPIDEMIA   . MIGRAINE HEADACHE   . POSTMENOPAUSAL SYNDROME   . TREMOR, ESSENTIAL   . URINARY INCONTINENCE   . VARICOSE VEINS, LOWER EXTREMITIES, MILD     Patient Active Problem List   Diagnosis Date Noted  . Primary osteoarthritis of both ankles 12/24/2016  . Closed fracture of base of fifth metatarsal bone with routine healing, right 12/03/2014  . Plantar fasciitis, left 12/03/2014  . DIARRHEA 02/04/2010  . VARICOSE VEINS, LOWER EXTREMITIES, MILD 12/30/2009  . DYSURIA 12/30/2009  . CARPAL TUNNEL SYNDROME, BILATERAL 09/27/2009  . DYSLIPIDEMIA 06/28/2009  . ANEMIA-NOS 06/28/2009  . ANXIETY STATE, UNSPECIFIED 06/28/2009  . TREMOR, ESSENTIAL 06/28/2009  . MIGRAINE HEADACHE 06/28/2009  . ALLERGIC RHINITIS 06/28/2009  . DIVERTICULOSIS OF COLON 06/28/2009  . VAGINITIS, ATROPHIC 06/28/2009  .  POSTMENOPAUSAL SYNDROME 06/28/2009  . ARTHRITIS 06/28/2009  . CHEST PAIN UNSPECIFIED 06/28/2009  . URINARY INCONTINENCE 06/28/2009  . COLONIC POLYPS, HX OF 06/28/2009    Past Surgical History:  Procedure Laterality Date  . ABDOMINAL HYSTERECTOMY  2004  . Broken leg  1967   had blood clot  . REPLACEMENT TOTAL KNEE  2008    OB History    No data available       Home Medications    Prior to Admission medications   Medication Sig Start Date End Date Taking? Authorizing Provider  amoxicillin-clavulanate (AUGMENTIN) 875-125 MG tablet Take 1 tablet by mouth 2 (two) times daily. One po bid x 7 days 03/05/17   Lurene Shadow, PA-C  meclizine (ANTIVERT) 25 MG tablet Take 1 tablet (25 mg total) by mouth 3 (three) times daily as needed for dizziness. 03/05/17   Lurene Shadow, PA-C    Family History Family History  Problem Relation Age of Onset  . Arthritis Father   . Lung cancer Father   . Hypertension Sister   . Hyperlipidemia Sister   . Coronary artery disease Sister   . Lung cancer Sister   . Arthritis Mother   . Heart disease Mother   . Alcohol abuse Other        Parents & grandparents  . Breast cancer Other   . Colon cancer Other   . Hyperlipidemia Other  Parent, grandparent  . Hypertension Other   . Ovarian cancer Other   . Stroke Other     Social History Social History  Substance Use Topics  . Smoking status: Never Smoker  . Smokeless tobacco: Never Used     Comment: Married, lives with spouse.  Marland Kitchen Alcohol use Yes     Comment: Occassional     Allergies   Codeine; Latex; and Morphine   Review of Systems Review of Systems  Constitutional: Negative for chills and fever.  HENT: Positive for congestion, ear pain ( fullness), sinus pain and sinus pressure. Negative for sore throat, trouble swallowing and voice change.   Respiratory: Positive for cough ( minimal). Negative for shortness of breath.   Cardiovascular: Negative for chest pain and  palpitations.  Gastrointestinal: Positive for diarrhea, nausea and vomiting. Negative for abdominal pain.  Musculoskeletal: Negative for arthralgias, back pain and myalgias.  Skin: Negative for rash.  Neurological: Positive for dizziness and headaches (head pressure). Negative for syncope and light-headedness.     Physical Exam Triage Vital Signs ED Triage Vitals  Enc Vitals Group     BP 03/05/17 0949 129/89     Pulse Rate 03/05/17 0949 76     Resp 03/05/17 0949 18     Temp 03/05/17 0949 97.8 F (36.6 C)     Temp Source 03/05/17 0949 Oral     SpO2 03/05/17 0949 97 %     Weight 03/05/17 0950 175 lb (79.4 kg)     Height 03/05/17 0950 5\' 1"  (1.549 m)     Head Circumference --      Peak Flow --      Pain Score 03/05/17 0950 0     Pain Loc --      Pain Edu? --      Excl. in GC? --    No data found.   Updated Vital Signs BP 129/89 (BP Location: Left Arm)   Pulse 76   Temp 97.8 F (36.6 C) (Oral)   Resp 18   Ht 5\' 1"  (1.549 m)   Wt 175 lb (79.4 kg)   SpO2 97%   BMI 33.07 kg/m   Visual Acuity Right Eye Distance:   Left Eye Distance:   Bilateral Distance:    Right Eye Near:   Left Eye Near:    Bilateral Near:     Physical Exam  Constitutional: She is oriented to person, place, and time. She appears well-developed and well-nourished. No distress.  HENT:  Head: Normocephalic and atraumatic.  Right Ear: Tympanic membrane normal.  Left Ear: Tympanic membrane normal.  Nose: Mucosal edema present. Right sinus exhibits maxillary sinus tenderness and frontal sinus tenderness. Left sinus exhibits maxillary sinus tenderness and frontal sinus tenderness.  Mouth/Throat: Uvula is midline, oropharynx is clear and moist and mucous membranes are normal.  Eyes: Pupils are equal, round, and reactive to light. Conjunctivae and EOM are normal.  Neck: Normal range of motion. Neck supple.  Cardiovascular: Normal rate and regular rhythm.   Pulmonary/Chest: Effort normal and breath  sounds normal. No stridor. No respiratory distress. She has no wheezes. She has no rales.  Musculoskeletal: Normal range of motion.  Lymphadenopathy:    She has no cervical adenopathy.  Neurological: She is alert and oriented to person, place, and time. No cranial nerve deficit.  CN II-XII in tact. Speech is clear. Alert to person place and time. Slow but steady gait.   Skin: Skin is warm and dry. She is not diaphoretic.  Psychiatric: She has a normal mood and affect. Her behavior is normal.  Nursing note and vitals reviewed.    UC Treatments / Results  Labs (all labs ordered are listed, but only abnormal results are displayed) Labs Reviewed - No data to display  EKG  EKG Interpretation None       Radiology No results found.  Procedures Procedures (including critical care time)  Medications Ordered in UC Medications  dexamethasone (DECADRON) injection 10 mg (10 mg Intramuscular Given 03/05/17 1004)     Initial Impression / Assessment and Plan / UC Course  I have reviewed the triage vital signs and the nursing notes.  Pertinent labs & imaging results that were available during my care of the patient were reviewed by me and considered in my medical decision making (see chart for details).     Hx and exam c/w vertigo secondary to sinusitis  Pt states she does better with steroid shots than prednisone pills. Decadron 10mg  IM given in UC  Will treat sinusitis with Augmentin F/u with PCP next week if needed Discussed symptoms that warrant emergent care in the ED.   Final Clinical Impressions(s) / UC Diagnoses   Final diagnoses:  Acute rhinosinusitis  Vertigo  Nausea vomiting and diarrhea    New Prescriptions New Prescriptions   AMOXICILLIN-CLAVULANATE (AUGMENTIN) 875-125 MG TABLET    Take 1 tablet by mouth 2 (two) times daily. One po bid x 7 days   MECLIZINE (ANTIVERT) 25 MG TABLET    Take 1 tablet (25 mg total) by mouth 3 (three) times daily as needed for  dizziness.     Controlled Substance Prescriptions [place] Controlled Substance Registry consulted? Not Applicable   Lurene Shadowhelps, Vineta Carone O, PA-C 03/05/17 1028

## 2017-03-05 NOTE — Discharge Instructions (Signed)
°  You may take 500mg acetaminophen every 4-6 hours or in combination with ibuprofen 400-600mg every 6-8 hours as needed for pain, inflammation, and fever. ° °Be sure to drink at least eight 8oz glasses of water to stay well hydrated and get at least 8 hours of sleep at night, preferably more while sick.  ° °

## 2017-03-05 NOTE — ED Triage Notes (Signed)
Pt c/o nasal congestion and ears feeling clogged x 1 wk. She c/o dizziness and blurry vision x last night. Reports 2 episodes of Diarrhea and vomiting at 0245 and 0500 today.

## 2017-03-07 ENCOUNTER — Telehealth: Payer: Self-pay | Admitting: Emergency Medicine

## 2017-03-07 NOTE — Telephone Encounter (Signed)
Patient states she is feeling a little better, however she knows she not 100 percent. I advised to f/u with PCP if she is not better by Tuesday

## 2017-03-09 DIAGNOSIS — R52 Pain, unspecified: Secondary | ICD-10-CM | POA: Diagnosis not present

## 2017-03-09 DIAGNOSIS — R112 Nausea with vomiting, unspecified: Secondary | ICD-10-CM | POA: Diagnosis not present

## 2017-03-09 DIAGNOSIS — R42 Dizziness and giddiness: Secondary | ICD-10-CM | POA: Diagnosis not present

## 2017-03-09 DIAGNOSIS — R6889 Other general symptoms and signs: Secondary | ICD-10-CM | POA: Diagnosis not present

## 2017-03-25 DIAGNOSIS — Z23 Encounter for immunization: Secondary | ICD-10-CM | POA: Diagnosis not present

## 2017-09-16 DIAGNOSIS — J309 Allergic rhinitis, unspecified: Secondary | ICD-10-CM | POA: Diagnosis not present

## 2017-10-14 ENCOUNTER — Ambulatory Visit: Payer: BLUE CROSS/BLUE SHIELD | Admitting: Sports Medicine

## 2017-10-14 ENCOUNTER — Ambulatory Visit (INDEPENDENT_AMBULATORY_CARE_PROVIDER_SITE_OTHER): Payer: BLUE CROSS/BLUE SHIELD

## 2017-10-14 ENCOUNTER — Encounter: Payer: Self-pay | Admitting: Sports Medicine

## 2017-10-14 DIAGNOSIS — M25061 Hemarthrosis, right knee: Secondary | ICD-10-CM

## 2017-10-14 DIAGNOSIS — M25562 Pain in left knee: Secondary | ICD-10-CM | POA: Diagnosis not present

## 2017-10-14 DIAGNOSIS — M25561 Pain in right knee: Secondary | ICD-10-CM

## 2017-10-14 DIAGNOSIS — S8992XA Unspecified injury of left lower leg, initial encounter: Secondary | ICD-10-CM | POA: Diagnosis not present

## 2017-10-14 NOTE — Progress Notes (Addendum)
Subjective:    I'm seeing this patient as a consultation for: Dr. Harvie Heck  CC: Right knee injury  HPI: Yesterday this pleasant 62 year old female was trying to reach across her car, twisted on her right knee and had immediate pain, swelling, inability to fully bend the knee.  She is had great difficulty bearing weight, pain is severe, persistent, localized at the medial joint line, posterior joint line without radiation.  I reviewed the past medical history, family history, social history, surgical history, and allergies today and no changes were needed.  Please see the problem list section below in epic for further details.  Past Medical History: Past Medical History:  Diagnosis Date  . ALLERGIC RHINITIS   . ANEMIA-NOS   . Anxiety state, unspecified   . ARTHRITIS   . CARPAL TUNNEL SYNDROME, BILATERAL   . Chest pain, atypical 09/11/2009   neg nuc stress test  . COLONIC POLYPS, HX OF 06/2009  . DIVERTICULOSIS OF COLON   . DYSLIPIDEMIA   . MIGRAINE HEADACHE   . POSTMENOPAUSAL SYNDROME   . TREMOR, ESSENTIAL   . URINARY INCONTINENCE   . VARICOSE VEINS, LOWER EXTREMITIES, MILD    Past Surgical History: Past Surgical History:  Procedure Laterality Date  . ABDOMINAL HYSTERECTOMY  2004  . Broken leg  1967   had blood clot  . REPLACEMENT TOTAL KNEE  2008   Social History: Social History   Socioeconomic History  . Marital status: Single    Spouse name: Not on file  . Number of children: Not on file  . Years of education: Not on file  . Highest education level: Not on file  Occupational History  . Not on file  Social Needs  . Financial resource strain: Not on file  . Food insecurity:    Worry: Not on file    Inability: Not on file  . Transportation needs:    Medical: Not on file    Non-medical: Not on file  Tobacco Use  . Smoking status: Never Smoker  . Smokeless tobacco: Never Used  . Tobacco comment: Married, lives with spouse.  Substance and Sexual  Activity  . Alcohol use: Yes    Comment: Occassional  . Drug use: No  . Sexual activity: Not on file  Lifestyle  . Physical activity:    Days per week: Not on file    Minutes per session: Not on file  . Stress: Not on file  Relationships  . Social connections:    Talks on phone: Not on file    Gets together: Not on file    Attends religious service: Not on file    Active member of club or organization: Not on file    Attends meetings of clubs or organizations: Not on file    Relationship status: Not on file  Other Topics Concern  . Not on file  Social History Narrative  . Not on file   Family History: Family History  Problem Relation Age of Onset  . Arthritis Father   . Lung cancer Father   . Hypertension Sister   . Hyperlipidemia Sister   . Coronary artery disease Sister   . Lung cancer Sister   . Arthritis Mother   . Heart disease Mother   . Alcohol abuse Other        Parents & grandparents  . Breast cancer Other   . Colon cancer Other   . Hyperlipidemia Other        Parent, grandparent  .  Hypertension Other   . Ovarian cancer Other   . Stroke Other    Allergies: Allergies  Allergen Reactions  . Codeine Nausea And Vomiting  . Latex   . Morphine     REACTION: sick on stomach   Medications: See med rec.  Review of Systems: No headache, visual changes, nausea, vomiting, diarrhea, constipation, dizziness, abdominal pain, skin rash, fevers, chills, night sweats, weight loss, swollen lymph nodes, body aches, joint swelling, muscle aches, chest pain, shortness of breath, mood changes, visual or auditory hallucinations.   Objective:   General: Well Developed, well nourished, and in no acute distress.  Neuro:  Extra-ocular muscles intact, able to move all 4 extremities, sensation grossly intact.  Deep tendon reflexes tested were normal. Psych: Alert and oriented, mood congruent with affect. ENT:  Ears and nose appear unremarkable.  Hearing grossly normal. Neck:  Unremarkable overall appearance, trachea midline.  No visible thyroid enlargement. Eyes: Conjunctivae and lids appear unremarkable.  Pupils equal and round. Skin: Warm and dry, no rashes noted.  Cardiovascular: Pulses palpable, no extremity edema. Right knee: Swollen, tender to palpation at the medial joint line, pain with flexion past 90 degrees.   Procedure: Real-time Ultrasound Guided aspiration/injection of right knee Device: GE Logiq E  Verbal informed consent obtained.  Time-out conducted.  Noted no overlying erythema, induration, or other signs of local infection.  Skin prepped in a sterile fashion.  Local anesthesia: Topical Ethyl chloride.  With sterile technique and under real time ultrasound guidance: Using a 22-gauge needle I aspirated approximately 22 mL of frank hemarthrosis, syringe switched and 1 cc kenalog 40, 2 cc lidocaine, 2 cc bupivacaine injected easily Completed without difficulty  Pain immediately resolved suggesting accurate placement of the medication.  Advised to call if fevers/chills, erythema, induration, drainage, or persistent bleeding.  Images permanently stored and available for review in the ultrasound unit.  Impression: Technically successful ultrasound guided injection.  Impression and Recommendations:   This case required medical decision making of moderate complexity.  Hemarthrosis of knee, right Aspiration of frank hemarthrosis, injection. X-rays, MRI, reaction knee brace. Return to go over MRI results.  Complete ACL tear, I would like her to touch base with Dr. Ramond Marrow, simply for a second opinion though I do not think that this is operative. Referral placed.  ___________________________________________ Ihor Austin. Benjamin Stain, M.D., ABFM., CAQSM. Primary Care and Sports Medicine [facility]  Adjunct Instructor of Family Medicine  [employer]

## 2017-10-14 NOTE — Assessment & Plan Note (Addendum)
Aspiration of frank hemarthrosis, injection. X-rays, MRI, reaction knee brace. Return to go over MRI results.  Complete ACL tear, I would like her to touch base with Dr. Ramond Marrow, simply for a second opinion though I do not think that this is operative. Referral placed.

## 2017-10-18 ENCOUNTER — Ambulatory Visit (INDEPENDENT_AMBULATORY_CARE_PROVIDER_SITE_OTHER): Payer: BLUE CROSS/BLUE SHIELD

## 2017-10-18 DIAGNOSIS — S8991XA Unspecified injury of right lower leg, initial encounter: Secondary | ICD-10-CM | POA: Diagnosis not present

## 2017-10-18 DIAGNOSIS — S83511A Sprain of anterior cruciate ligament of right knee, initial encounter: Secondary | ICD-10-CM | POA: Diagnosis not present

## 2017-10-18 DIAGNOSIS — X58XXXA Exposure to other specified factors, initial encounter: Secondary | ICD-10-CM

## 2017-10-18 DIAGNOSIS — M25561 Pain in right knee: Secondary | ICD-10-CM | POA: Diagnosis not present

## 2017-10-18 DIAGNOSIS — M7121 Synovial cyst of popliteal space [Baker], right knee: Secondary | ICD-10-CM | POA: Diagnosis not present

## 2017-10-18 DIAGNOSIS — M25061 Hemarthrosis, right knee: Secondary | ICD-10-CM

## 2017-10-19 NOTE — Addendum Note (Signed)
Addended by: Monica BectonHEKKEKANDAM, THOMAS J on: 10/19/2017 01:38 PM   Modules accepted: Orders

## 2017-10-27 DIAGNOSIS — M25561 Pain in right knee: Secondary | ICD-10-CM | POA: Diagnosis not present

## 2017-11-01 DIAGNOSIS — R6889 Other general symptoms and signs: Secondary | ICD-10-CM | POA: Diagnosis not present

## 2017-11-01 DIAGNOSIS — M25561 Pain in right knee: Secondary | ICD-10-CM | POA: Diagnosis not present

## 2017-12-06 DIAGNOSIS — M25561 Pain in right knee: Secondary | ICD-10-CM | POA: Diagnosis not present

## 2017-12-11 DIAGNOSIS — M25561 Pain in right knee: Secondary | ICD-10-CM | POA: Diagnosis not present

## 2017-12-16 DIAGNOSIS — M23303 Other meniscus derangements, unspecified medial meniscus, right knee: Secondary | ICD-10-CM | POA: Diagnosis not present

## 2017-12-16 DIAGNOSIS — S83411A Sprain of medial collateral ligament of right knee, initial encounter: Secondary | ICD-10-CM | POA: Diagnosis not present

## 2017-12-16 DIAGNOSIS — S83211A Bucket-handle tear of medial meniscus, current injury, right knee, initial encounter: Secondary | ICD-10-CM | POA: Diagnosis not present

## 2017-12-16 DIAGNOSIS — M23611 Other spontaneous disruption of anterior cruciate ligament of right knee: Secondary | ICD-10-CM | POA: Diagnosis not present

## 2017-12-16 DIAGNOSIS — M25461 Effusion, right knee: Secondary | ICD-10-CM | POA: Diagnosis not present

## 2017-12-16 DIAGNOSIS — M23631 Other spontaneous disruption of medial collateral ligament of right knee: Secondary | ICD-10-CM | POA: Diagnosis not present

## 2017-12-16 DIAGNOSIS — M7121 Synovial cyst of popliteal space [Baker], right knee: Secondary | ICD-10-CM | POA: Diagnosis not present

## 2017-12-16 DIAGNOSIS — S83511A Sprain of anterior cruciate ligament of right knee, initial encounter: Secondary | ICD-10-CM | POA: Diagnosis not present

## 2017-12-17 DIAGNOSIS — M25461 Effusion, right knee: Secondary | ICD-10-CM | POA: Diagnosis not present

## 2017-12-17 DIAGNOSIS — S83511D Sprain of anterior cruciate ligament of right knee, subsequent encounter: Secondary | ICD-10-CM | POA: Diagnosis not present

## 2017-12-17 DIAGNOSIS — M25561 Pain in right knee: Secondary | ICD-10-CM | POA: Diagnosis not present

## 2018-02-25 DIAGNOSIS — R112 Nausea with vomiting, unspecified: Secondary | ICD-10-CM | POA: Diagnosis not present

## 2018-02-25 DIAGNOSIS — H6502 Acute serous otitis media, left ear: Secondary | ICD-10-CM | POA: Diagnosis not present

## 2018-02-25 DIAGNOSIS — H8113 Benign paroxysmal vertigo, bilateral: Secondary | ICD-10-CM | POA: Diagnosis not present

## 2018-03-29 DIAGNOSIS — Z23 Encounter for immunization: Secondary | ICD-10-CM | POA: Diagnosis not present

## 2018-07-19 DIAGNOSIS — R05 Cough: Secondary | ICD-10-CM | POA: Diagnosis not present

## 2018-07-19 DIAGNOSIS — J309 Allergic rhinitis, unspecified: Secondary | ICD-10-CM | POA: Diagnosis not present

## 2018-07-19 DIAGNOSIS — Z1239 Encounter for other screening for malignant neoplasm of breast: Secondary | ICD-10-CM | POA: Diagnosis not present

## 2018-07-19 DIAGNOSIS — J018 Other acute sinusitis: Secondary | ICD-10-CM | POA: Diagnosis not present

## 2018-08-29 IMAGING — MR MR KNEE*R* W/O CM
6 series · 40 of 40 positions shown · non-contrast
Comparison: None.

CLINICAL DATA: The patient suffered a stretching injury of the
right knee 10/13/2017 with onset of pain. Right knee popping and
catching.

EXAM:
MRI OF THE RIGHT KNEE WITHOUT CONTRAST
TECHNIQUE: Multiplanar, multisequence MR imaging of the knee was performed. No
intravenous contrast was administered.

[Series 3: PD fat-sat · axial · 3.0mm · 0.35mm/px · z∈[-92,+21]mm · 7 of 35 slices shown (1 of 4)]
[im 1/35]
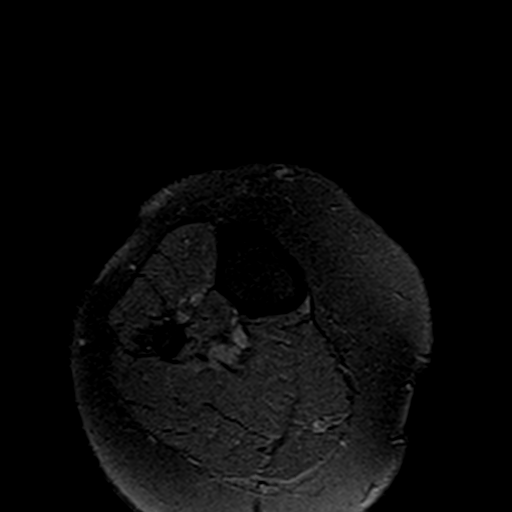
[im 6/35]
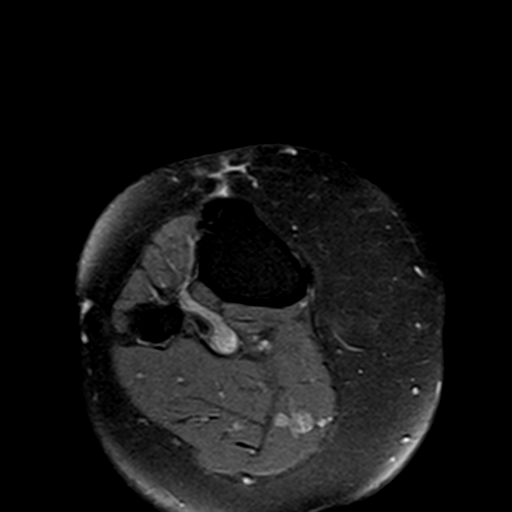
[im 12/35]
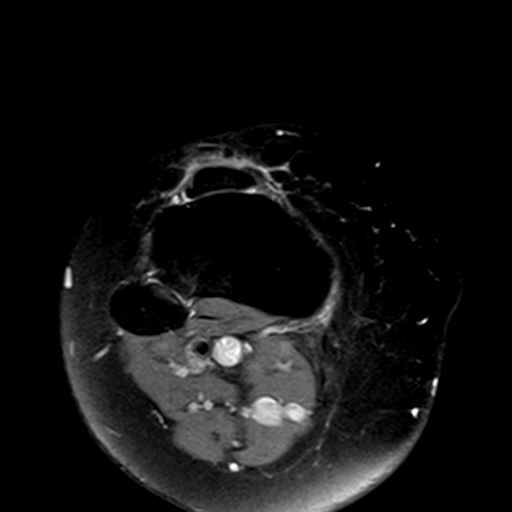
[im 18/35]
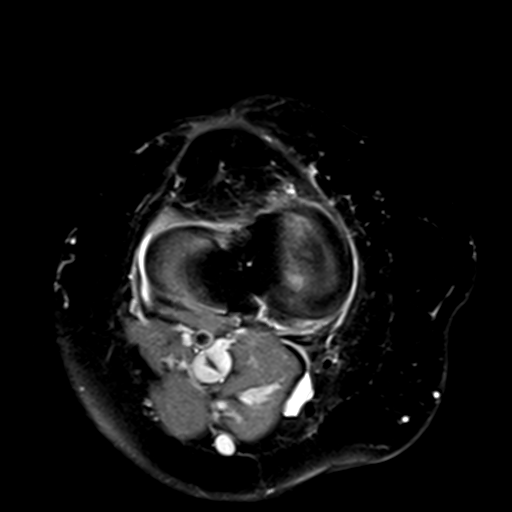
[im 23/35]
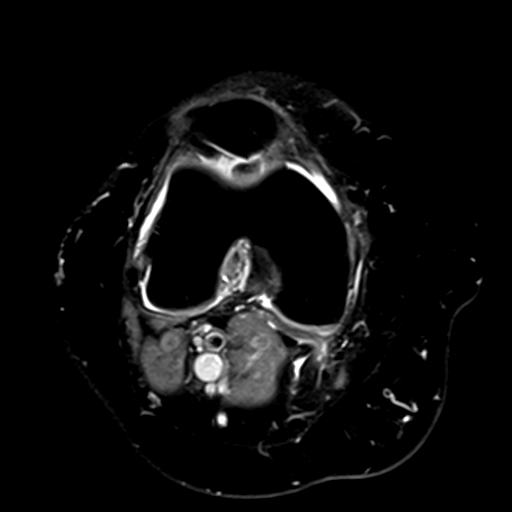
[im 29/35]
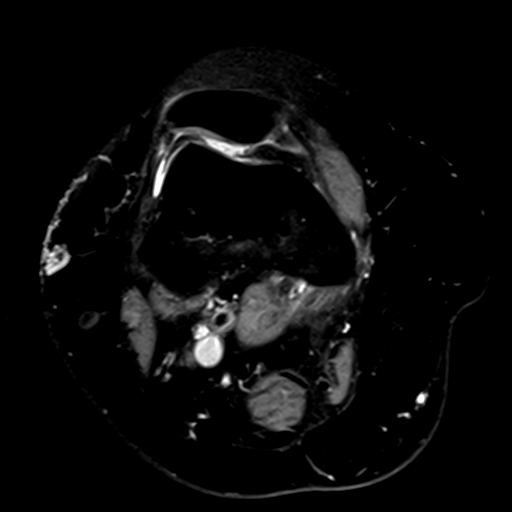
[im 35/35]
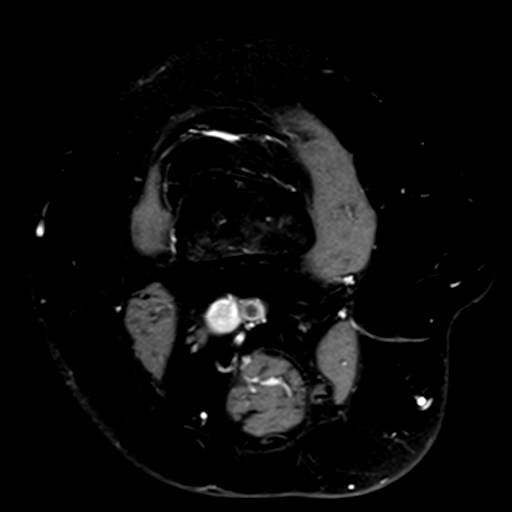

[Series 4: T1 · coronal · 3.0mm · 0.50mm/px · 7 of 31 slices shown]
[im 1/31]
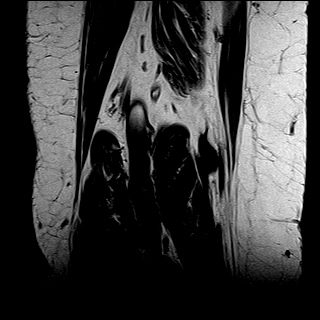
[im 6/31]
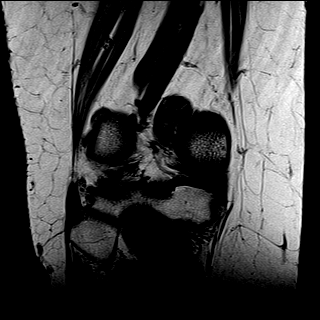
[im 11/31]
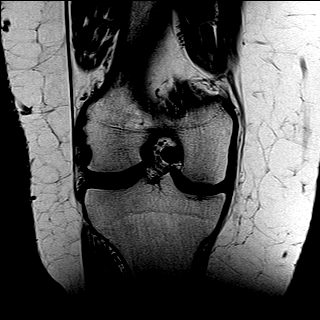
[im 16/31]
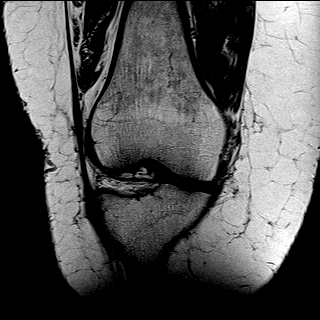
[im 21/31]
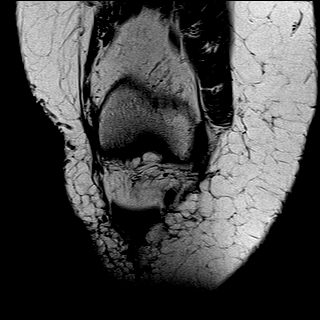
[im 26/31]
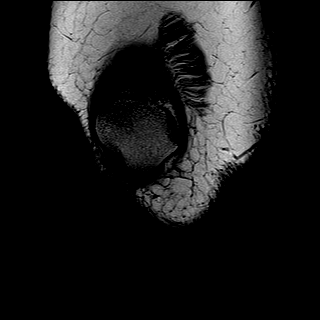
[im 31/31]
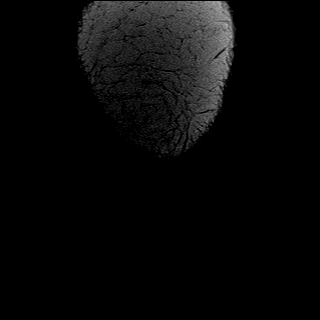

[Series 5: T2 fat-sat · coronal · 3.0mm · 0.50mm/px · 7 of 31 slices shown]
[im 1/31]
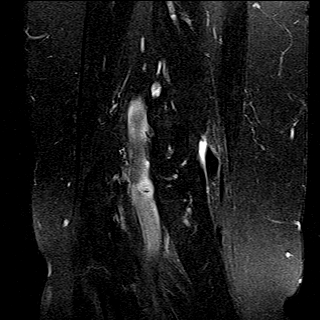
[im 6/31]
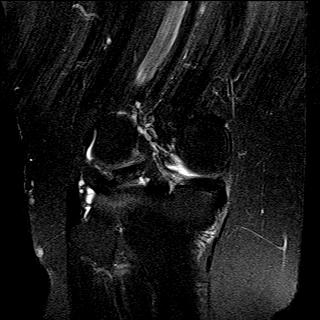
[im 11/31]
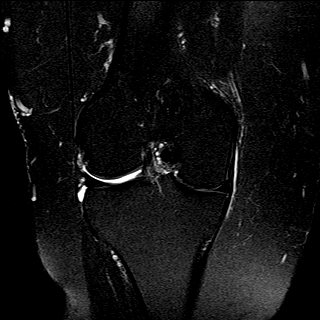
[im 16/31]
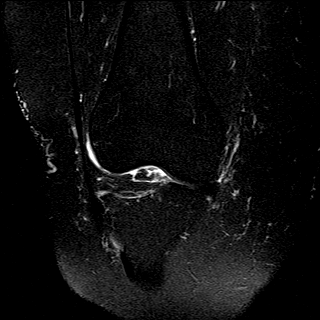
[im 21/31]
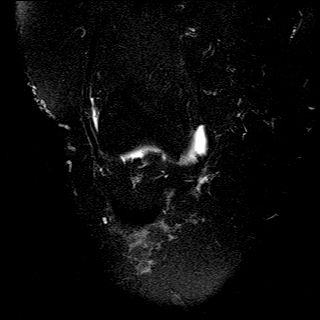
[im 26/31]
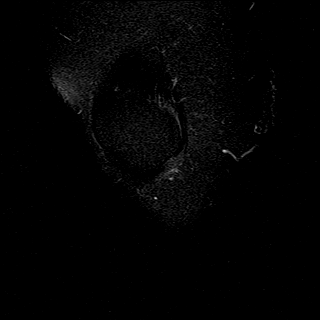
[im 31/31]
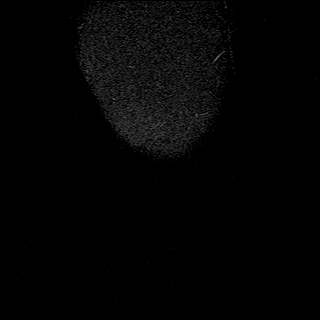

[Series 6: PD fat-sat · coronal · 3.0mm · 0.62mm/px · 7 of 31 slices shown (2 of 4)]
[im 1/31]
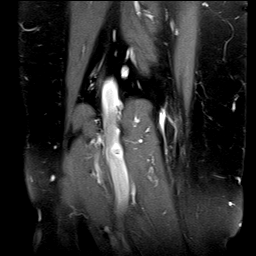
[im 6/31]
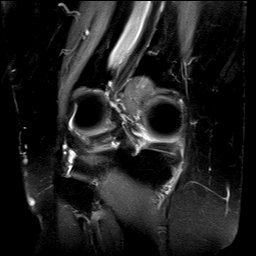
[im 11/31]
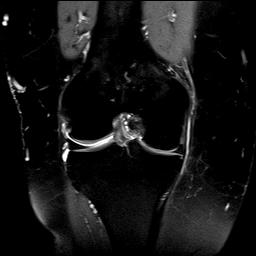
[im 16/31]
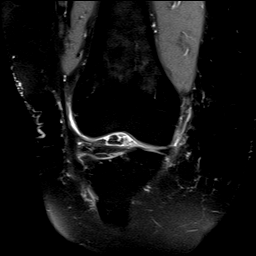
[im 21/31]
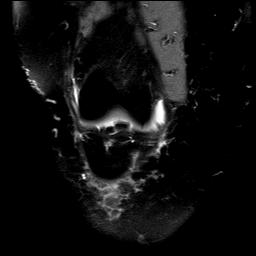
[im 26/31]
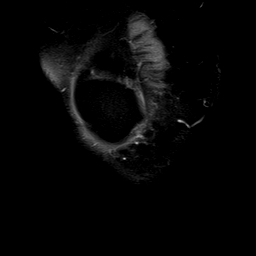
[im 31/31]
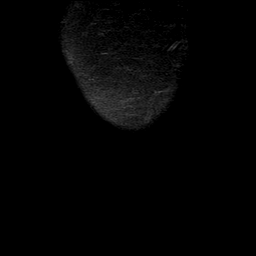

[Series 7: PD fat-sat · sagittal · 3.0mm · 0.62mm/px · 8 of 35 slices shown (3 of 4)]
[im 1/35]
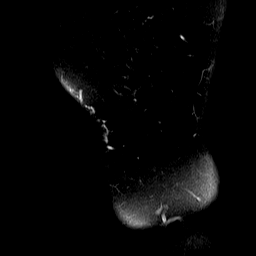
[im 5/35]
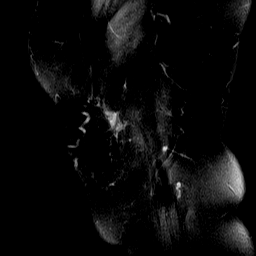
[im 10/35]
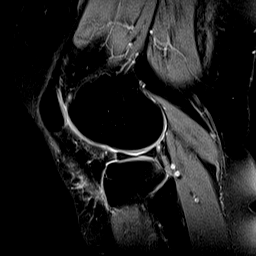
[im 15/35]
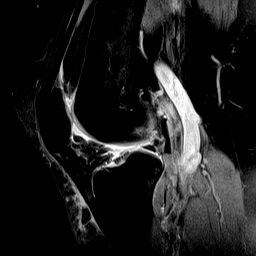
[im 20/35]
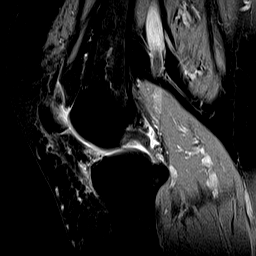
[im 25/35]
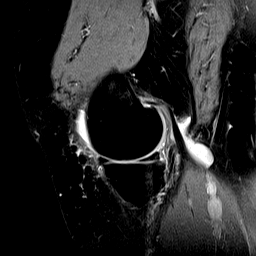
[im 30/35]
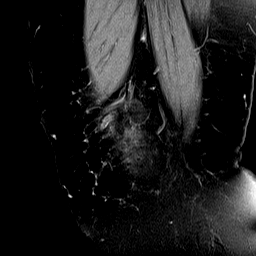
[im 35/35]
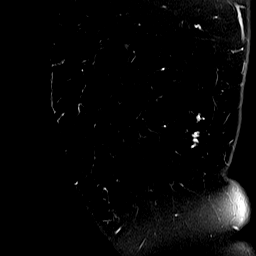

[Series 8: PD fat-sat · coronal · 2.0mm · 0.62mm/px · 4 of 19 slices shown (4 of 4)]
[im 1/19]
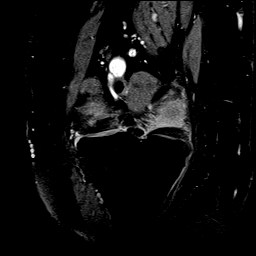
[im 7/19]
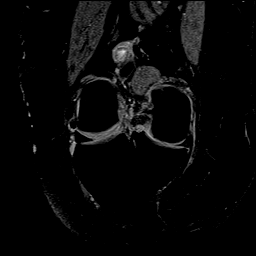
[im 13/19]
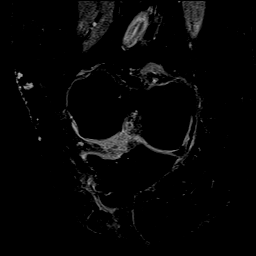
[im 19/19]
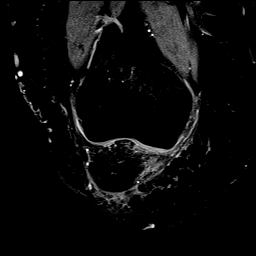

[40 of 40 positions shown; findings below may reference images not displayed]

FINDINGS: MENISCI

Medial meniscus:  Intact.

Lateral meniscus:  Intact.

LIGAMENTS

Cruciates: The patient has a complete or near complete tear of the
anterior cruciate ligament from the femur. Intact fibers are
identified. PCL is normal.

Collaterals:  Intact.

CARTILAGE

Patellofemoral:  Preserved.

Medial:  Preserved.

Lateral:  Preserved.

Joint:  Trace amount of joint fluid.

Popliteal Fossa: Baker's cyst measures 1.7 cm AP by 0.6 cm
transverse by 3.1 cm craniocaudal.

Extensor Mechanism:  Intact.

Bones: Small bone contusion is seen in the posterior aspect of the
lateral tibial plateau. No fracture.

Other: None.
IMPRESSION: Complete or near complete tear of the ACL from the femur is age
indeterminate. There is a small bone bruise in the posterior aspect
of the tibia but the typical bone bruise pattern and large effusion
seen in acute ACL tear are not present.

Negative for meniscal or collateral ligament tear.
